# Patient Record
Sex: Female | Born: 1995 | Hispanic: Yes | Marital: Single
Health system: Western US, Academic
[De-identification: ages and names within clinical notes are randomized; demographics above are authoritative.]

## PROBLEM LIST (undated history)

## (undated) ENCOUNTER — Inpatient Hospital Stay (HOSPITAL_COMMUNITY): Payer: Self-pay

## (undated) DIAGNOSIS — Z789 Other specified health status: Secondary | ICD-10-CM

## (undated) HISTORY — PX: ANTERIOR CRUCIATE LIGAMENT REPAIR: SHX115

---
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
## 2015-07-25 ENCOUNTER — Encounter (HOSPITAL_COMMUNITY): Payer: Self-pay

## 2015-07-25 DIAGNOSIS — R300 Dysuria: Secondary | ICD-10-CM | POA: Diagnosis not present

## 2015-07-25 DIAGNOSIS — O210 Mild hyperemesis gravidarum: Secondary | ICD-10-CM | POA: Insufficient documentation

## 2015-07-25 DIAGNOSIS — Z3A10 10 weeks gestation of pregnancy: Secondary | ICD-10-CM | POA: Insufficient documentation

## 2015-07-25 DIAGNOSIS — R197 Diarrhea, unspecified: Secondary | ICD-10-CM | POA: Diagnosis not present

## 2015-07-25 DIAGNOSIS — O9989 Other specified diseases and conditions complicating pregnancy, childbirth and the puerperium: Secondary | ICD-10-CM | POA: Diagnosis present

## 2015-07-25 DIAGNOSIS — O418X11 Other specified disorders of amniotic fluid and membranes, first trimester, fetus 1: Secondary | ICD-10-CM | POA: Insufficient documentation

## 2015-07-25 LAB — I-STAT BETA HCG BLOOD, ED (MC, WL, AP ONLY): I-stat hCG, quantitative: >2000 m[IU]/mL — ABNORMAL HIGH (ref <5)

## 2015-07-25 MED ORDER — ACETAMINOPHEN 325 MG PO TABS
650.0000 mg | ORAL_TABLET | Freq: Once | ORAL | Status: AC
  Administered 2015-07-25: 650 mg via ORAL

## 2015-07-25 MED ORDER — ACETAMINOPHEN 325 MG PO TABS
ORAL_TABLET | ORAL | Status: AC
  Filled 2015-07-25: qty 2

## 2015-07-25 NOTE — ED Notes (Signed)
Pt is [redacted] weeks pregnant and has been having intermittent lower abd pain for about a week now. Tonight it became severe. Had some sharp abd pain and brown and clear discharge. Called the MD and they said the discharge is normal.

## 2015-07-26 ENCOUNTER — Emergency Department (HOSPITAL_COMMUNITY)
Admission: EM | Admit: 2015-07-26 | Discharge: 2015-07-26 | Disposition: A | Discharge status: Home / Self Care | Payer: Medicaid - Out of State | Attending: Emergency Medicine | Admitting: Emergency Medicine

## 2015-07-26 ENCOUNTER — Emergency Department (HOSPITAL_COMMUNITY): Payer: Medicaid - Out of State

## 2015-07-26 DIAGNOSIS — R109 Unspecified abdominal pain: Secondary | ICD-10-CM

## 2015-07-26 DIAGNOSIS — O468X1 Other antepartum hemorrhage, first trimester: Secondary | ICD-10-CM

## 2015-07-26 DIAGNOSIS — O418X10 Other specified disorders of amniotic fluid and membranes, first trimester, not applicable or unspecified: Secondary | ICD-10-CM

## 2015-07-26 DIAGNOSIS — Z3491 Encounter for supervision of normal pregnancy, unspecified, first trimester: Secondary | ICD-10-CM

## 2015-07-26 LAB — CBC WITH DIFFERENTIAL/PLATELET
BASOS ABS: 0 10*3/uL (ref 0.0–0.1)
BASOS PCT: 0 %
EOS ABS: 0 10*3/uL (ref 0.0–0.7)
EOS PCT: 0 %
HCT: 40.7 % (ref 36.0–46.0)
Hemoglobin: 13.9 g/dL (ref 12.0–15.0)
Lymphocytes Relative: 13 %
Lymphs Abs: 1.3 10*3/uL (ref 0.7–4.0)
MCH: 30.8 pg (ref 26.0–34.0)
MCHC: 34.2 g/dL (ref 30.0–36.0)
MCV: 90 fL (ref 78.0–100.0)
Monocytes Absolute: 0.5 10*3/uL (ref 0.1–1.0)
Monocytes Relative: 5 %
NEUTROS PCT: 82 %
Neutro Abs: 8.5 10*3/uL — ABNORMAL HIGH (ref 1.7–7.7)
Platelets: 252 10*3/uL (ref 150–400)
RBC: 4.52 MIL/uL (ref 3.87–5.11)
RDW: 13.1 % (ref 11.5–15.5)
WBC: 10.3 10*3/uL (ref 4.0–10.5)

## 2015-07-26 LAB — COMPREHENSIVE METABOLIC PANEL
ALT: 18 U/L (ref 14–54)
AST: 28 U/L (ref 15–41)
Albumin: 4.1 g/dL (ref 3.5–5.0)
Alkaline Phosphatase: 81 U/L (ref 38–126)
Anion gap: 11 (ref 5–15)
BILIRUBIN TOTAL: 1.2 mg/dL (ref 0.3–1.2)
BUN: 6 mg/dL (ref 6–20)
CALCIUM: 9.2 mg/dL (ref 8.9–10.3)
CO2: 23 mmol/L (ref 22–32)
CREATININE: 0.52 mg/dL (ref 0.44–1.00)
Chloride: 104 mmol/L (ref 101–111)
GFR calc Af Amer: >60 mL/min (ref >60)
Glucose, Bld: 62 mg/dL — ABNORMAL LOW (ref 65–99)
Potassium: 3.8 mmol/L (ref 3.5–5.1)
Sodium: 138 mmol/L (ref 135–145)
Total Protein: 7 g/dL (ref 6.5–8.1)

## 2015-07-26 LAB — TYPE AND SCREEN
ABO/RH(D): O POS
ANTIBODY SCREEN: NEGATIVE

## 2015-07-26 LAB — HCG, QUANTITATIVE, PREGNANCY: HCG, BETA CHAIN, QUANT, S: 144677 m[IU]/mL — AB (ref <5)

## 2015-07-26 LAB — URINALYSIS, ROUTINE W REFLEX MICROSCOPIC
BILIRUBIN URINE: NEGATIVE
Glucose, UA: NEGATIVE mg/dL
Hgb urine dipstick: NEGATIVE
Ketones, ur: 40 mg/dL — AB
Leukocytes, UA: NEGATIVE
Nitrite: NEGATIVE
Protein, ur: NEGATIVE mg/dL
SPECIFIC GRAVITY, URINE: 1.021 (ref 1.005–1.030)
pH: 7 (ref 5.0–8.0)

## 2015-07-26 LAB — ABO/RH: ABO/RH(D): O POS

## 2015-07-26 NOTE — ED Notes (Signed)
Pt has returned from ultra sound.  Waiting for a pelvic

## 2015-07-26 NOTE — ED Notes (Signed)
Lower abd pain for 3 weeks the pt is [redacted] weeks pregnant.  lmp nov she has had nv and one time diarrhea  edc aug 18th  .  She has had a brownish vaginal discharge since she was one month pregnant.  Now she has the discharge 2-3 times a week

## 2015-07-26 NOTE — ED Notes (Signed)
To us

## 2015-07-26 NOTE — ED Notes (Signed)
Pelvic cart setup bedside. 

## 2015-07-26 NOTE — ED Provider Notes (Signed)
CSN: [id number]     Arrival date & time 07/25/15  2003 History   First MD Initiated Contact with Patient 07/26/15 0054     Chief Complaint  Patient presents with  . Abdominal Pain     (Consider location/radiation/quality/duration/timing/severity/associated sxs/prior Treatment) HPI Comments: 20 year old female G1P0 at 9 weeks by LMP 05/21/15 who p/w abdominal pain. The patient states that she has had intermittent lower abdominal pain for the past one week. She has had intermittent spotting with brown discharge for the past one month but this has improved recently. Spotting only occurs 2-3 times per week. She denies any vaginal bleeding. Tonight, the abdominal pain became more severe which is why she presented for evaluation. She has had daily nausea and vomiting consistent with morning sickness. One episode of diarrhea a few days ago. No fevers. She has had mild intermittent dysuria for the past 1-2 weeks.  Patient is a 20 y.o. female presenting with abdominal pain. The history is provided by the patient.  Abdominal Pain   History reviewed. No pertinent past medical history. Past Surgical History  Procedure Laterality Date  . Anterior cruciate ligament repair Right    No family history on file. Social History  Substance Use Topics  . Smoking status: Never Smoker   . Smokeless tobacco: None  . Alcohol Use: No   OB History    Gravida Para Term Preterm AB TAB SAB Ectopic Multiple Living   1              Review of Systems  Gastrointestinal: Positive for abdominal pain.   10 Systems reviewed and are negative for acute change except as noted in the HPI.    Allergies  Review of patient's allergies indicates no known allergies.  Home Medications   Prior to Admission medications   Medication Sig Start Date End Date Taking? Authorizing Provider  Prenatal Vit-Fe Fumarate-FA (MULTIVITAMIN-PRENATAL) 27-0.8 MG TABS tablet Take 1 tablet by mouth daily at 12 noon.   Yes Historical  Provider, MD  ibuprofen (ADVIL,MOTRIN) 600 MG tablet Take 1 tablet (600 mg total) by mouth every 6 (six) hours as needed. Patient not taking: Reported on 07/26/2015 09/10/14   Loren Raceravid Yelverton, MD   BP 105/61 mmHg  Pulse 69  Temp(Src) 98.5 F (36.9 C) (Oral)  Resp 18  SpO2 98%  LMP 09/09/2014 Physical Exam  Constitutional: She is oriented to person, place, and time. She appears well-developed and well-nourished. No distress.  HENT:  Head: Normocephalic and atraumatic.  Moist mucous membranes  Eyes: Conjunctivae are normal. Pupils are equal, round, and reactive to light.  Neck: Neck supple.  Cardiovascular: Normal rate, regular rhythm and normal heart sounds.   No murmur heard. Pulmonary/Chest: Effort normal and breath sounds normal.  Abdominal: Soft. Bowel sounds are normal. She exhibits no distension. There is no rebound and no guarding.  Mild suprapubic TTP   Musculoskeletal: She exhibits no edema.  Neurological: She is alert and oriented to person, place, and time.  Fluent speech  Skin: Skin is warm and dry.  Psychiatric: She has a normal mood and affect. Judgment normal.  Nursing note and vitals reviewed.   ED Course  Procedures (including critical care time) Labs Review Labs Reviewed  COMPREHENSIVE METABOLIC PANEL - Abnormal; Notable for the following:    Glucose, Bld 62 (*)    All other components within normal limits  CBC WITH DIFFERENTIAL/PLATELET - Abnormal; Notable for the following:    Neutro Abs 8.5 (*)    All other  components within normal limits  HCG, QUANTITATIVE, PREGNANCY - Abnormal; Notable for the following:    hCG, Beta Chain, [place] Longest [id number] (*)    All other components within normal limits  URINALYSIS, ROUTINE W REFLEX MICROSCOPIC (NOT AT [facility]) - Abnormal; Notable for the following:    APPearance CLOUDY (*)    Ketones, ur 40 (*)    All other components within normal limits  I-STAT BETA HCG BLOOD, ED ([facility], [facility], [facility] ONLY) - Abnormal; Notable for the  following:    I-stat hCG, quantitative >2000.0 (*)    All other components within normal limits  TYPE AND SCREEN  ABO/RH  GC/CHLAMYDIA PROBE AMP ([place]) NOT AT [facility]    Imaging Review US Ob Comp Less 14 Wks  07/26/2015  CLINICAL DATA:  Acute onset of generalized abdominal pain. Initial encounter. EXAM: OBSTETRIC <14 WK ULTRASOUND TECHNIQUE: Transabdominal ultrasound was performed for evaluation of the gestation as well as the maternal uterus and adnexal regions. COMPARISON:  None. FINDINGS: Intrauterine gestational sac: Visualized/normal in shape. Yolk sac:  Yes Embryo:  Yes Cardiac Activity: Yes Heart Rate: 169 bpm CRL:   29.7  mm   9 w 6 d                  [place] EDC: 02/22/2016 Subchorionic hemorrhage: A small amount of subchorionic hemorrhage is noted. Maternal uterus/adnexae: The ovaries are unremarkable in appearance. The right ovary measures 3.0 x 1.5 cm, while the left ovary measures 3.5 x 1.9 x 2.5 cm. No suspicious adnexal masses are seen; there is no evidence for ovarian torsion. Trace free fluid is seen within the pelvic cul-de-sac. IMPRESSION: 1. Single live intrauterine pregnancy noted, with a crown-rump length of 3.0 cm, corresponding to a gestational age of [redacted] weeks 6 days. This matches the gestational age of [redacted] weeks 3 days by LMP, reflecting an estimated delivery of February 25, 2016. 2. Small amount of subchorionic hemorrhage noted. Electronically Signed   By: Roanna Raider M.D.   On: 07/26/2015 03:30   I have personally reviewed and evaluated these lab results as part of my medical decision-making.   EKG Interpretation None      MDM   Final diagnoses:  First trimester pregnancy  Subchorionic bleed, first trimester   Pt [redacted] weeks pregnant by LMP p/w 1 week of intermittent lower abdominal pain as well as 1 month of intermittent spotting of brown discharge. Patient well-appearing with normal vital signs. Mild suprapubic tenderness on exam. Obtained above labs as well as pelvic  ultrasound. Patient is Rh+. Labs including CMP unremarkable.  US shows 9wk6d pregnancy w/ small subchorionic hemorrhage. I discussed findings w/ patient. I offered pelvic exam for complete evaluation but pt politely refused as she is not having vaginal bleeding. I instructed to follow-up with OB/GYN; pt already has an appointment scheduled. Return precautions including heavy vaginal bleeding, worsening pain, or fever reviewed. Patient voiced understanding was discharged in satisfactory condition.   Laurence Spates, MD 07/26/15 [phone]

## 2017-06-27 IMAGING — US US OB COMP LESS 14 WK
1 series · 14 of 23 positions shown · non-contrast
Comparison: None.

CLINICAL DATA: Acute onset of generalized abdominal pain. Initial
encounter.

EXAM:
OBSTETRIC <14 WK ULTRASOUND
TECHNIQUE: Transabdominal ultrasound was performed for evaluation of the
gestation as well as the maternal uterus and adnexal regions.

[Series 1: us ob comp less 14 wk · 0.20mm/px · 14 of 23 slices shown]
[im 1/23]
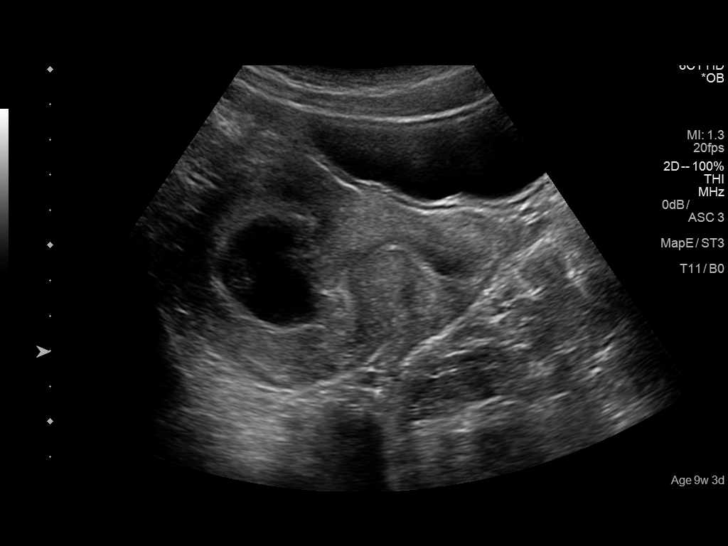
[im 3/23]
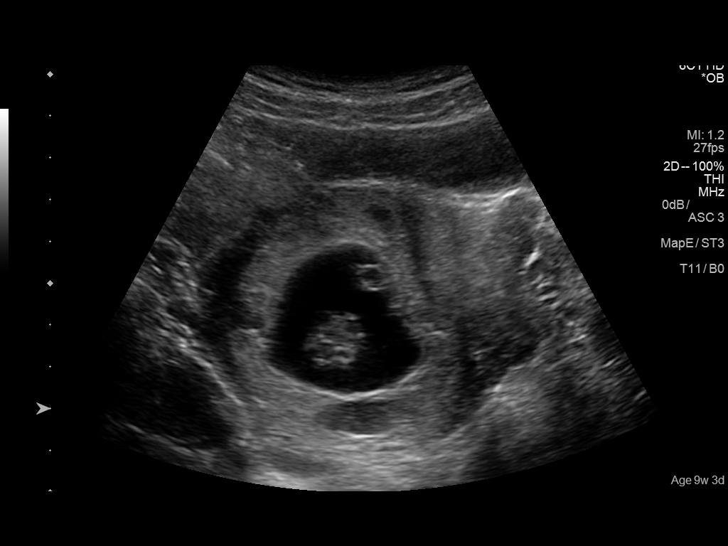
[im 5/23]
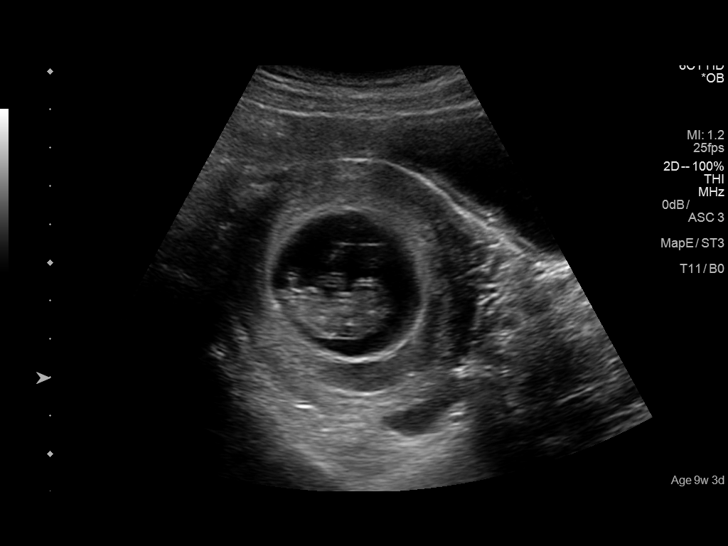
[im 6/23]
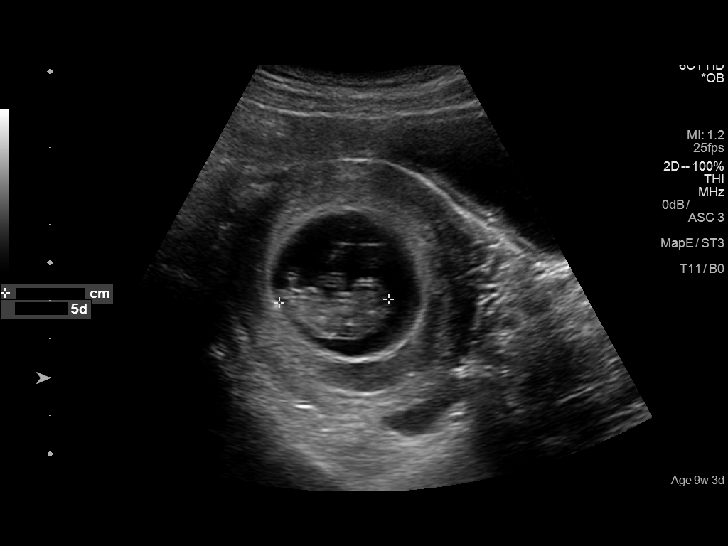
[im 8/23]
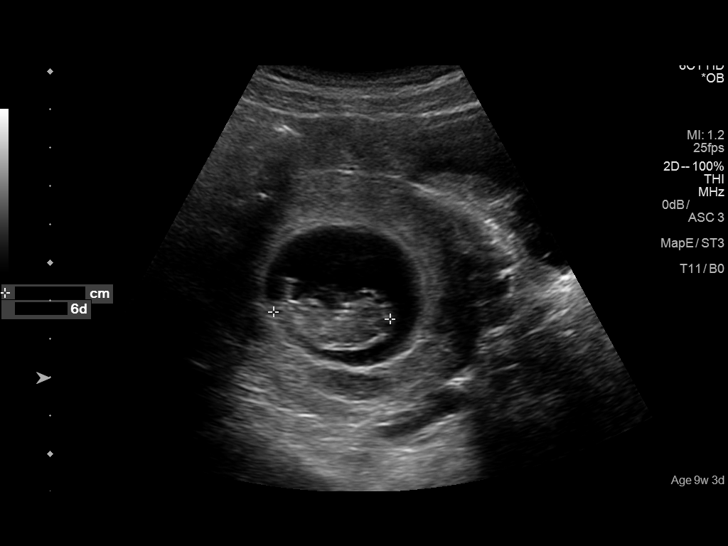
[im 10/23]
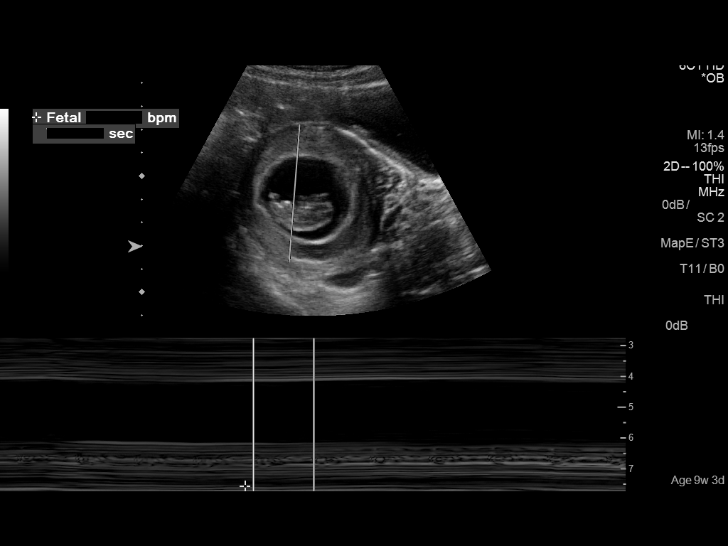
[im 11/23]
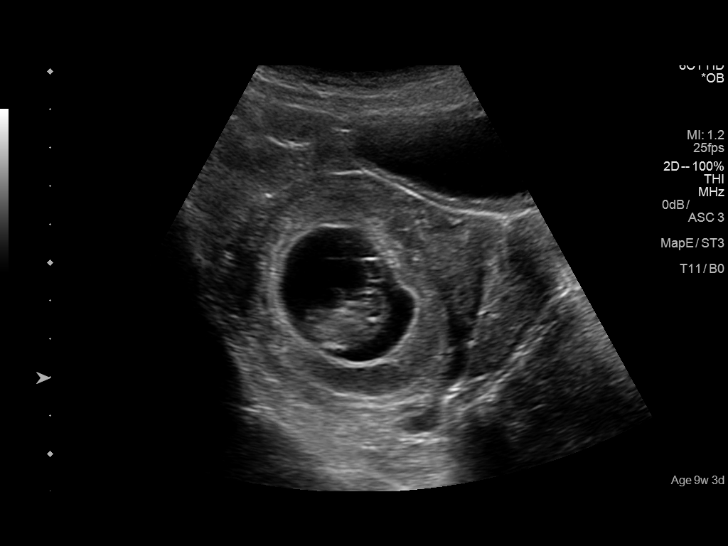
[im 13/23]
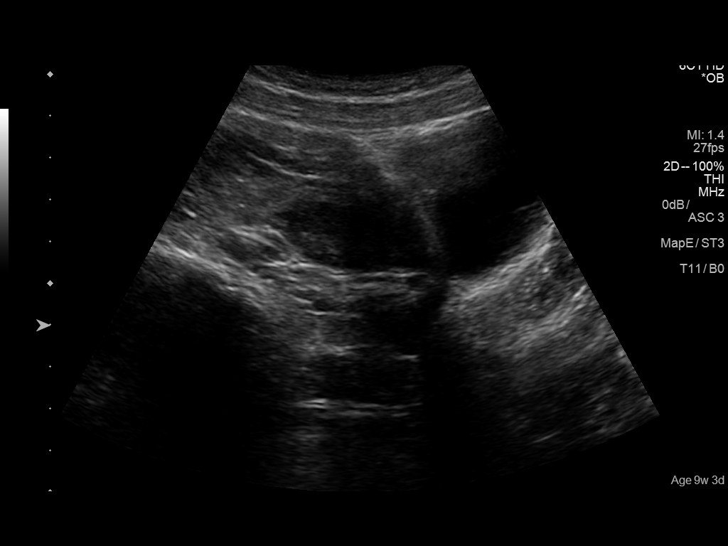
[im 14/23]
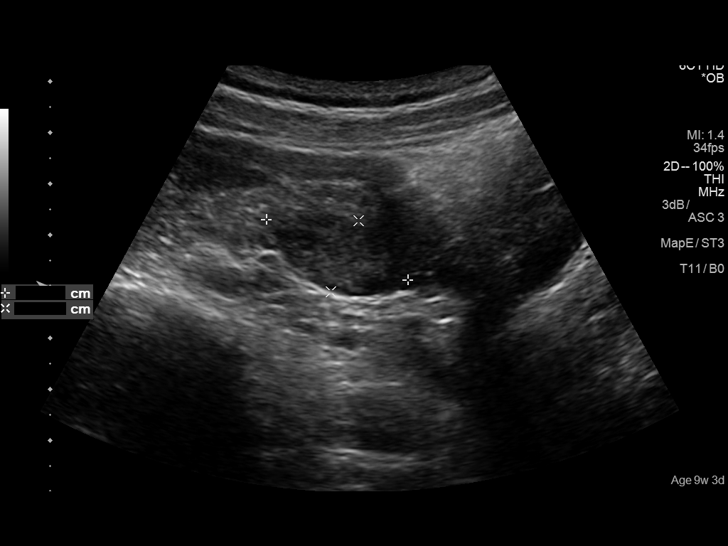
[im 16/23]
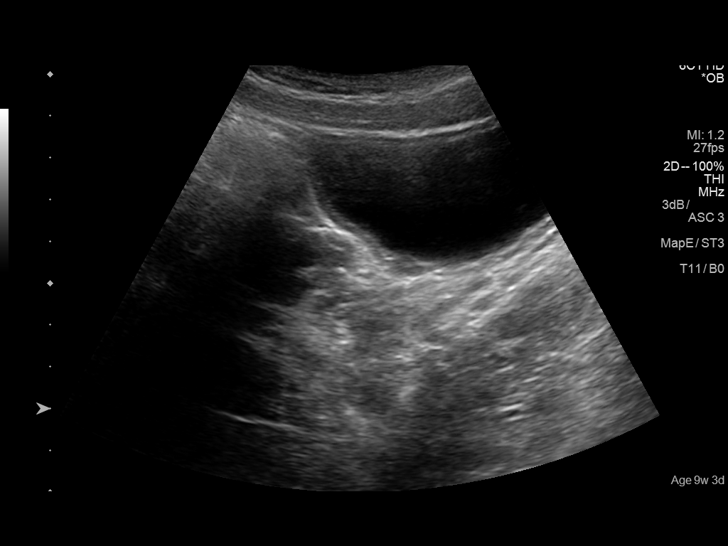
[im 18/23]
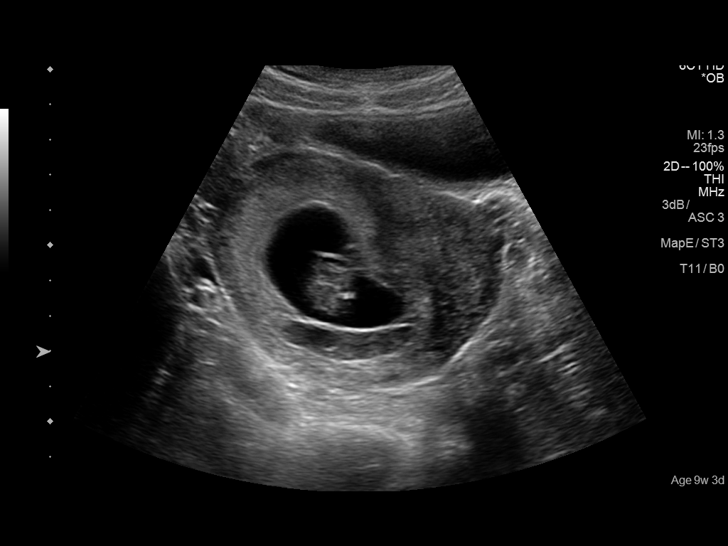
[im 19/23]
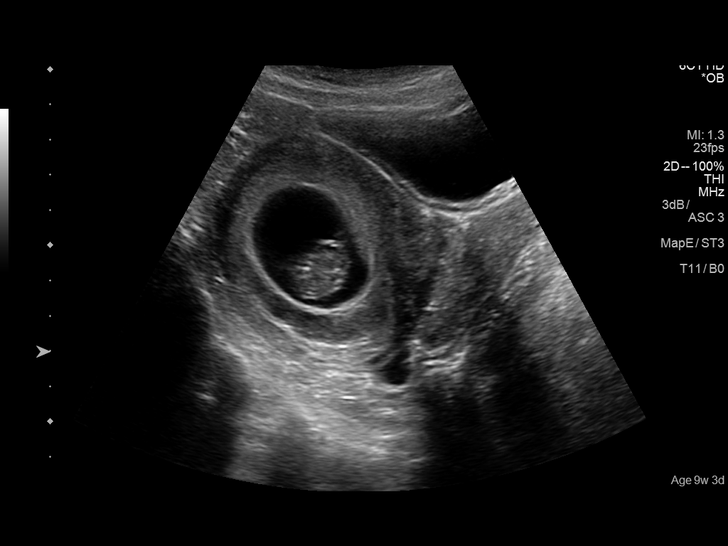
[im 21/23]
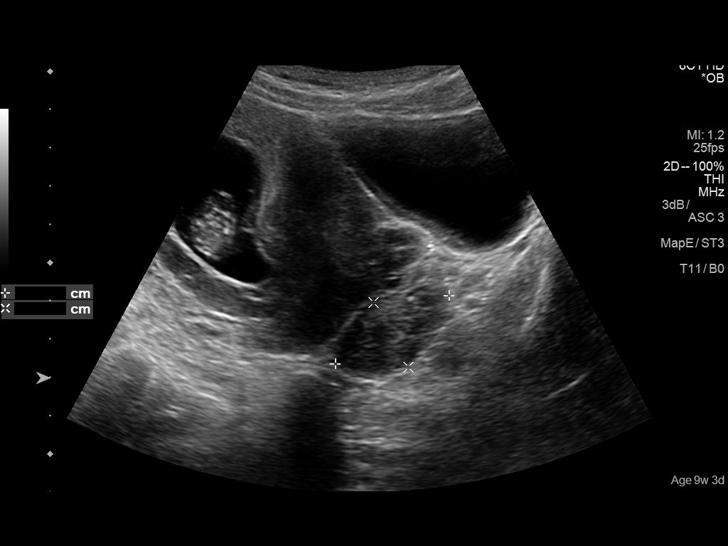
[im 23/23]
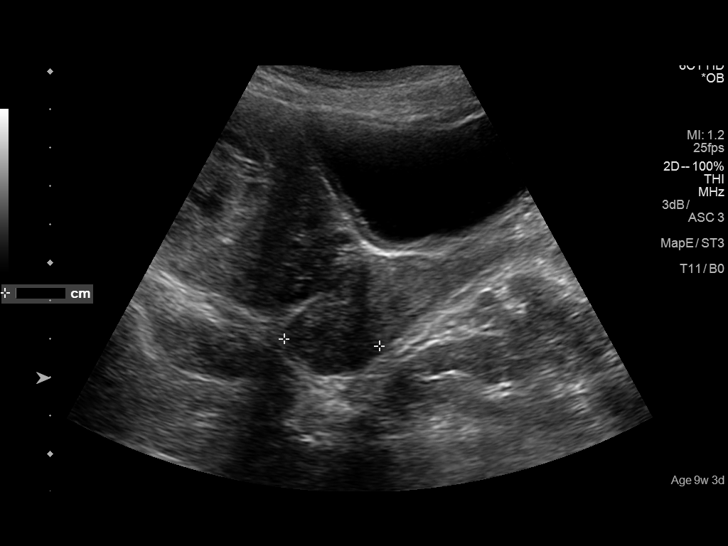

[14 of 23 positions shown; findings below may reference images not displayed]

FINDINGS: Intrauterine gestational sac: Visualized/normal in shape.

Yolk sac:  Yes

Embryo:  Yes

Cardiac Activity: Yes

Heart Rate: 169 bpm

CRL:   29.7  mm   9 w 6 d                  US EDC: 02/22/2016

Subchorionic hemorrhage: A small amount of subchorionic hemorrhage
is noted.

Maternal uterus/adnexae: The ovaries are unremarkable in appearance.
The right ovary measures 3.0 x 1.5 cm, while the left ovary measures
3.5 x 1.9 x 2.5 cm. No suspicious adnexal masses are seen; there is
no evidence for ovarian torsion.

Trace free fluid is seen within the pelvic cul-de-sac.
IMPRESSION: 1. Single live intrauterine pregnancy noted, with a crown-rump
length of 3.0 cm, corresponding to a gestational age of 9 weeks 6
days. This matches the gestational age of 9 weeks 3 days by LMP,
reflecting an estimated delivery February 25, 2016.
2. Small amount of subchorionic hemorrhage noted.

## 2017-09-18 ENCOUNTER — Emergency Department (HOSPITAL_COMMUNITY)
Admission: EM | Admit: 2017-09-18 | Discharge: 2017-09-18 | Discharge status: Against Medical Advice | Payer: PRIVATE HEALTH INSURANCE

## 2017-09-18 ENCOUNTER — Other Ambulatory Visit: Payer: Self-pay

## 2017-09-18 NOTE — ED Notes (Signed)
Called x 2 NO answer 

## 2018-01-22 ENCOUNTER — Encounter (HOSPITAL_COMMUNITY): Payer: Self-pay | Admitting: *Deleted

## 2018-01-22 ENCOUNTER — Other Ambulatory Visit: Payer: Self-pay | Admitting: Student

## 2018-01-22 ENCOUNTER — Inpatient Hospital Stay (HOSPITAL_COMMUNITY)
Admission: AD | Admit: 2018-01-22 | Discharge: 2018-01-22 | Discharge status: Against Medical Advice | Payer: Medicaid - Out of State | Source: Ambulatory Visit | Attending: Obstetrics & Gynecology | Admitting: Obstetrics & Gynecology

## 2018-01-22 ENCOUNTER — Emergency Department (HOSPITAL_COMMUNITY)
Admission: EM | Admit: 2018-01-22 | Discharge: 2018-01-22 | Discharge status: Against Medical Advice | Payer: PRIVATE HEALTH INSURANCE | Source: Home / Self Care

## 2018-01-22 ENCOUNTER — Telehealth: Payer: Self-pay | Admitting: Student

## 2018-01-22 ENCOUNTER — Ambulatory Visit (HOSPITAL_COMMUNITY)
Admission: EM | Admit: 2018-01-22 | Discharge: 2018-01-22 | Disposition: A | Discharge status: Home / Self Care | Payer: PRIVATE HEALTH INSURANCE | Source: Home / Self Care

## 2018-01-22 DIAGNOSIS — O26891 Other specified pregnancy related conditions, first trimester: Secondary | ICD-10-CM | POA: Insufficient documentation

## 2018-01-22 DIAGNOSIS — Z3A01 Less than 8 weeks gestation of pregnancy: Secondary | ICD-10-CM | POA: Diagnosis not present

## 2018-01-22 DIAGNOSIS — O26899 Other specified pregnancy related conditions, unspecified trimester: Secondary | ICD-10-CM

## 2018-01-22 DIAGNOSIS — R109 Unspecified abdominal pain: Principal | ICD-10-CM

## 2018-01-22 DIAGNOSIS — Z5321 Procedure and treatment not carried out due to patient leaving prior to being seen by health care provider: Secondary | ICD-10-CM | POA: Insufficient documentation

## 2018-01-22 DIAGNOSIS — O3680X0 Pregnancy with inconclusive fetal viability, not applicable or unspecified: Secondary | ICD-10-CM

## 2018-01-22 HISTORY — DX: Other specified health status: Z78.9

## 2018-01-22 LAB — URINALYSIS, ROUTINE W REFLEX MICROSCOPIC
Bilirubin Urine: NEGATIVE
Glucose, UA: NEGATIVE mg/dL
Hgb urine dipstick: NEGATIVE
Ketones, ur: NEGATIVE mg/dL
LEUKOCYTES UA: NEGATIVE
Nitrite: NEGATIVE
Protein, ur: NEGATIVE mg/dL
Specific Gravity, Urine: 1.016 (ref 1.005–1.030)
pH: 7 (ref 5.0–8.0)

## 2018-01-22 LAB — COMPREHENSIVE METABOLIC PANEL
ALK PHOS: 74 U/L (ref 38–126)
ALT: 14 U/L (ref 0–44)
AST: 20 U/L (ref 15–41)
Albumin: 4.2 g/dL (ref 3.5–5.0)
Anion gap: 8 (ref 5–15)
BILIRUBIN TOTAL: 1.7 mg/dL — AB (ref 0.3–1.2)
BUN: 10 mg/dL (ref 6–20)
CALCIUM: 8.9 mg/dL (ref 8.9–10.3)
CO2: 25 mmol/L (ref 22–32)
CREATININE: 0.56 mg/dL (ref 0.44–1.00)
Chloride: 102 mmol/L (ref 98–111)
GFR calc Af Amer: >60 mL/min (ref >60)
Glucose, Bld: 92 mg/dL (ref 70–99)
POTASSIUM: 3.8 mmol/L (ref 3.5–5.1)
Sodium: 135 mmol/L (ref 135–145)
TOTAL PROTEIN: 7.3 g/dL (ref 6.5–8.1)

## 2018-01-22 LAB — CBC
HEMATOCRIT: 38.7 % (ref 36.0–46.0)
HEMOGLOBIN: 13 g/dL (ref 12.0–15.0)
MCH: 29.1 pg (ref 26.0–34.0)
MCHC: 33.6 g/dL (ref 30.0–36.0)
MCV: 86.6 fL (ref 78.0–100.0)
Platelets: 271 10*3/uL (ref 150–400)
RBC: 4.47 MIL/uL (ref 3.87–5.11)
RDW: 13.3 % (ref 11.5–15.5)
WBC: 9.6 10*3/uL (ref 4.0–10.5)

## 2018-01-22 LAB — HCG, QUANTITATIVE, PREGNANCY: hCG, Beta Chain, Quant, S: 48 m[IU]/mL — ABNORMAL HIGH (ref <5)

## 2018-01-22 LAB — POCT PREGNANCY, URINE: PREG TEST UR: POSITIVE — AB

## 2018-01-22 NOTE — Telephone Encounter (Signed)
Called patient and informed her that her quant was [id number], and that she does not need to come for US tonight. Instead, [facility] for stat beta.  Reviewed bleeding and pain precautions; patient to come to [facility] if her symptoms worsen or change. Patient verbalized understanding.

## 2018-01-22 NOTE — ED Notes (Signed)
Pt presents with complaints of positive pregnancy test at home and missing one period. Pt endorses abdominal pain. [facility]

## 2018-01-22 NOTE — MAU Note (Signed)
Pt requesting to leave AMA, states she needs to go pick up her husband. Pt signs AMA papers and leaves after blood is drawn.

## 2018-01-22 NOTE — MAU Note (Signed)
Pt reports a positive preg test yesterday, has pressure like she needs to urinate all the time, lower abd pain, lower back pain.

## 2018-01-22 NOTE — MAU Provider Note (Signed)
Patient was here with positive pregnancy test and cramping; she declined to wait to be seen. Discussed with her the importance of staying for ectopic rule-out, and patient agrees to have beta HCG drawn, CBC and CMP drawn. States that she will return tonight at 859 for US. Cultures  and exam. Signed AMA papers and left.   Shannon Peters 01/22/2018, 6:25 PM

## 2018-01-24 ENCOUNTER — Inpatient Hospital Stay (HOSPITAL_COMMUNITY)
Admission: AD | Admit: 2018-01-24 | Discharge: 2018-01-25 | Disposition: A | Discharge status: Home / Self Care | Payer: Medicaid Other | Source: Ambulatory Visit | Attending: Obstetrics & Gynecology | Admitting: Obstetrics & Gynecology

## 2018-01-24 ENCOUNTER — Other Ambulatory Visit: Payer: Self-pay

## 2018-01-24 ENCOUNTER — Encounter (HOSPITAL_COMMUNITY): Payer: Self-pay

## 2018-01-24 DIAGNOSIS — N83201 Unspecified ovarian cyst, right side: Secondary | ICD-10-CM | POA: Diagnosis not present

## 2018-01-24 DIAGNOSIS — O26899 Other specified pregnancy related conditions, unspecified trimester: Secondary | ICD-10-CM

## 2018-01-24 DIAGNOSIS — O3680X0 Pregnancy with inconclusive fetal viability, not applicable or unspecified: Secondary | ICD-10-CM

## 2018-01-24 DIAGNOSIS — Z3A01 Less than 8 weeks gestation of pregnancy: Secondary | ICD-10-CM | POA: Diagnosis not present

## 2018-01-24 DIAGNOSIS — N8311 Corpus luteum cyst of right ovary: Secondary | ICD-10-CM

## 2018-01-24 DIAGNOSIS — O26891 Other specified pregnancy related conditions, first trimester: Secondary | ICD-10-CM | POA: Diagnosis not present

## 2018-01-24 DIAGNOSIS — Z79899 Other long term (current) drug therapy: Secondary | ICD-10-CM | POA: Diagnosis not present

## 2018-01-24 DIAGNOSIS — O3482 Maternal care for other abnormalities of pelvic organs, second trimester: Secondary | ICD-10-CM | POA: Insufficient documentation

## 2018-01-24 DIAGNOSIS — R102 Pelvic and perineal pain: Secondary | ICD-10-CM | POA: Insufficient documentation

## 2018-01-24 DIAGNOSIS — R109 Unspecified abdominal pain: Secondary | ICD-10-CM | POA: Diagnosis present

## 2018-01-24 LAB — CBC
HCT: 34.5 % — ABNORMAL LOW (ref 36.0–46.0)
HEMOGLOBIN: 11.9 g/dL — AB (ref 12.0–15.0)
MCH: 29.5 pg (ref 26.0–34.0)
MCHC: 34.5 g/dL (ref 30.0–36.0)
MCV: 85.4 fL (ref 78.0–100.0)
PLATELETS: 244 10*3/uL (ref 150–400)
RBC: 4.04 MIL/uL (ref 3.87–5.11)
RDW: 13.3 % (ref 11.5–15.5)
WBC: 8.4 10*3/uL (ref 4.0–10.5)

## 2018-01-24 NOTE — MAU Note (Signed)
Pt states that she started feeling abdominal cramping 4-5 days ago. The pt states that tonight the cramping has intensified.   She states that she feels like something is moving in the top of her abdomen.   Denies vaginal bleeding.

## 2018-01-25 ENCOUNTER — Ambulatory Visit: Payer: PRIVATE HEALTH INSURANCE

## 2018-01-25 ENCOUNTER — Inpatient Hospital Stay (HOSPITAL_COMMUNITY): Payer: Medicaid Other

## 2018-01-25 DIAGNOSIS — O26891 Other specified pregnancy related conditions, first trimester: Secondary | ICD-10-CM

## 2018-01-25 DIAGNOSIS — Z3A01 Less than 8 weeks gestation of pregnancy: Secondary | ICD-10-CM

## 2018-01-25 DIAGNOSIS — R102 Pelvic and perineal pain: Secondary | ICD-10-CM

## 2018-01-25 LAB — HIV ANTIBODY (ROUTINE TESTING W REFLEX): HIV Screen 4th Generation wRfx: NONREACTIVE

## 2018-01-25 LAB — WET PREP, GENITAL
Clue Cells Wet Prep HPF POC: NONE SEEN
Sperm: NONE SEEN
Trich, Wet Prep: NONE SEEN
YEAST WET PREP: NONE SEEN

## 2018-01-25 LAB — URINALYSIS, MICROSCOPIC (REFLEX): RBC / HPF: NONE SEEN RBC/hpf (ref 0–5)

## 2018-01-25 LAB — URINALYSIS, ROUTINE W REFLEX MICROSCOPIC
BILIRUBIN URINE: NEGATIVE
Glucose, UA: NEGATIVE mg/dL
Hgb urine dipstick: NEGATIVE
KETONES UR: NEGATIVE mg/dL
NITRITE: NEGATIVE
PROTEIN: NEGATIVE mg/dL
Specific Gravity, Urine: 1.02 (ref 1.005–1.030)
pH: 6 (ref 5.0–8.0)

## 2018-01-25 LAB — GC/CHLAMYDIA PROBE AMP (~~LOC~~) NOT AT ARMC
Chlamydia: NEGATIVE
NEISSERIA GONORRHEA: NEGATIVE

## 2018-01-25 LAB — HCG, QUANTITATIVE, PREGNANCY: HCG, BETA CHAIN, QUANT, S: 194 m[IU]/mL — AB (ref <5)

## 2018-01-25 NOTE — MAU Provider Note (Signed)
Chief Complaint: Abdominal Pain   Seen by provider at 0030 hrs    SUBJECTIVE HPI: Shannon Peters is a 22 y.o. G2P1000 at 3132w6d by LMP who presents to maternity admissions reporting pelvic cramping   Also feels movement and "pulsing" on the top of her abdomen at times. . She denies vaginal bleeding, vaginal itching/burning, urinary symptoms, h/a, dizziness, n/v, or fever/chills.   Was seen a couple of days ago but did not stay for exam  Hormone level for pregnancy was low, so was scheduled to have a repeat level tomorrow morning.   Abdominal Pain  This is a recurrent problem. The current episode started today. The onset quality is gradual. The problem occurs intermittently. The problem has been unchanged. The pain is located in the suprapubic region and epigastric region. The pain is mild. The quality of the pain is cramping. The abdominal pain does not radiate. Pertinent negatives include no constipation, diarrhea, dysuria, fever, myalgias, nausea or vomiting. Nothing aggravates the pain. The pain is relieved by nothing. She has tried nothing for the symptoms.   RN Note: Pt states that she started feeling abdominal cramping 4-5 days ago. The pt states that tonight the cramping has intensified.  She states that she feels like something is moving in the top of her abdomen.  Denies vaginal bleeding.    Past Medical History:  Diagnosis Date  . Medical history non-contributory    Past Surgical History:  Procedure Laterality Date  . ANTERIOR CRUCIATE LIGAMENT REPAIR Right    Social History   Socioeconomic History  . Marital status: Married    Spouse name: Not on file  . Number of children: Not on file  . Years of education: Not on file  . Highest education level: Not on file  Occupational History  . Not on file  Social Needs  . Financial resource strain: Not on file  . Food insecurity:    Worry: Not on file    Inability: Not on file  . Transportation needs:    Medical: Not on  file    Non-medical: Not on file  Tobacco Use  . Smoking status: Never Smoker  . Smokeless tobacco: Never Used  Substance and Sexual Activity  . Alcohol use: No  . Drug use: No  . Sexual activity: Yes    Birth control/protection: None  Lifestyle  . Physical activity:    Days per week: Not on file    Minutes per session: Not on file  . Stress: Not on file  Relationships  . Social connections:    Talks on phone: Not on file    Gets together: Not on file    Attends religious service: Not on file    Active member of club or organization: Not on file    Attends meetings of clubs or organizations: Not on file    Relationship status: Not on file  . Intimate partner violence:    Fear of current or ex partner: Not on file    Emotionally abused: Not on file    Physically abused: Not on file    Forced sexual activity: Not on file  Other Topics Concern  . Not on file  Social History Narrative  . Not on file   No current facility-administered medications on file prior to encounter.    Current Outpatient Medications on File Prior to Encounter  Medication Sig Dispense Refill  . ibuprofen (ADVIL,MOTRIN) 600 MG tablet Take 1 tablet (600 mg total) by mouth every 6 (six)  hours as needed. (Patient not taking: Reported on 07/26/2015) 30 tablet 0  . Prenatal Vit-Fe Fumarate-FA (MULTIVITAMIN-PRENATAL) 27-0.8 MG TABS tablet Take 1 tablet by mouth daily at 12 noon.     No Known Allergies  I have reviewed patient's Past Medical Hx, Surgical Hx, Family Hx, Social Hx, medications and allergies.   ROS:  Review of Systems  Constitutional: Negative for fever.  Gastrointestinal: Positive for abdominal pain. Negative for constipation, diarrhea, nausea and vomiting.  Genitourinary: Negative for dysuria.  Musculoskeletal: Negative for myalgias.   Review of Systems  Other systems negative   Physical Exam  Physical Exam Patient Vitals for the past 24 hrs:  BP Temp Pulse Resp SpO2 Weight   01/24/18 2330 120/67 98.4 F (36.9 C) 78 12 100 % -  01/24/18 2325 - - - - - 117 lb 6 oz (53.2 kg)   Constitutional: Well-developed, well-nourished female in no acute distress.  Cardiovascular: normal rate Respiratory: normal effort GI: Abd soft, non-tender. Pos BS x 4 MS: Extremities nontender, no edema, normal ROM Neurologic: Alert and oriented x 4.  GU: Neg CVAT.  PELVIC EXAM: Cervix pink, visually closed, without lesion, scant white creamy discharge, vaginal walls and external genitalia normal Bimanual exam: Cervix 0/long/high, firm, anterior, neg CMT, uterus nontender, nonenlarged, adnexa without tenderness, enlargement, or mass  LAB RESULTS Results for orders placed or performed during the hospital encounter of 01/24/18 (from the past 24 hour(s))  CBC     Status: Abnormal   Collection Time: 01/24/18 11:50 PM  Result Value Ref Range   WBC 8.4 4.0 - 10.5 K/uL   RBC 4.04 3.87 - 5.11 MIL/uL   Hemoglobin 11.9 (L) 12.0 - 15.0 g/dL   HCT 09.8 (L) 11.9 - 14.7 %   MCV 85.4 78.0 - 100.0 fL   MCH 29.5 26.0 - 34.0 pg   MCHC 34.5 30.0 - 36.0 g/dL   RDW 82.9 56.2 - 13.0 %   Platelets 244 150 - 400 K/uL  hCG, quantitative, pregnancy     Status: Abnormal   Collection Time: 01/24/18 11:50 PM  Result Value Ref Range   hCG, Beta Chain, Quant, S 194 (H) <5 mIU/mL    Ref. Range 01/22/2018 18:19  HCG, Beta Chain, Quant, S Latest Ref Range: <5 mIU/mL 48 (H)       IMAGING US Ob Comp Less 14 Wks  Result Date: 01/25/2018 CLINICAL DATA:  22 year old positive HCG levels presenting with female with pelvic pain. EXAM: OBSTETRIC <14 WK [place] AND TRANSVAGINAL OB US TECHNIQUE: Both transabdominal and transvaginal ultrasound examinations were performed for complete evaluation of the gestation as well as the maternal uterus, adnexal regions, and pelvic cul-de-sac. Transvaginal technique was performed to assess early pregnancy. COMPARISON:  None. FINDINGS: The uterus is anteverted appears unremarkable.  The endometrium is unremarkable and measures up to 18 mm at the fundus. No intrauterine pregnancy identified. The right ovary measures 3.6 x 2.4 x 2.2 cm. There is a 3.5 x 3.3 x 3.0 cm complex cyst with lace-like internal structures in the right ovary most consistent with a hemorrhagic cyst or corpus luteum. No internal vascularity within this is noted. The left ovary measures 3.1 x 1.8 x 1.9 cm appears unremarkable. No free fluid within the pelvis. IMPRESSION: 1. No intrauterine pregnancy identified and no adnexal masses. This is considered a pregnancy of unknown location and differential diagnosis includes an early pregnancy, recent spontaneous abortion, or an ectopic pregnancy. Clinical correlation and follow-up with serial HCG levels and ultrasound recommended.  2. Right ovarian hemorrhagic cyst/corpus luteum. Electronically Signed   By: Elgie Collard M.D.   On: 01/25/2018 01:49   US Ob Transvaginal  Result Date: 01/25/2018 CLINICAL DATA:  22 year old positive HCG levels presenting with female with pelvic pain. EXAM: OBSTETRIC <14 WK [place] AND TRANSVAGINAL OB US TECHNIQUE: Both transabdominal and transvaginal ultrasound examinations were performed for complete evaluation of the gestation as well as the maternal uterus, adnexal regions, and pelvic cul-de-sac. Transvaginal technique was performed to assess early pregnancy. COMPARISON:  None. FINDINGS: The uterus is anteverted appears unremarkable. The endometrium is unremarkable and measures up to 18 mm at the fundus. No intrauterine pregnancy identified. The right ovary measures 3.6 x 2.4 x 2.2 cm. There is a 3.5 x 3.3 x 3.0 cm complex cyst with lace-like internal structures in the right ovary most consistent with a hemorrhagic cyst or corpus luteum. No internal vascularity within this is noted. The left ovary measures 3.1 x 1.8 x 1.9 cm appears unremarkable. No free fluid within the pelvis. IMPRESSION: 1. No intrauterine pregnancy identified and no adnexal  masses. This is considered a pregnancy of unknown location and differential diagnosis includes an early pregnancy, recent spontaneous abortion, or an ectopic pregnancy. Clinical correlation and follow-up with serial HCG levels and ultrasound recommended. 2. Right ovarian hemorrhagic cyst/corpus luteum. Electronically Signed   By: Elgie Collard M.D.   On: 01/25/2018 01:49    MAU Management/MDM: Ordered usual first trimester r/o ectopic labs.   Pelvic exam and cultures done Will check baseline Ultrasound to rule out ectopic.  This bleeding/pain can represent a normal pregnancy with bleeding, spontaneous abortion or even an ectopic which can be life-threatening.  The process as listed above helps to determine which of these is present.  HCG level more than doubled in less than 48 hours US showed nothing in uterus, as expected Discussed we cannot rule out ectopic just yet Will do one more HCG in 2 days then I ordered followup US in 10 days (around 29th)  ASSESSMENT 1. Pelvic pain affecting pregnancy   2. Pelvic pain affecting pregnancy   3.   Pregnancy of unknown location 4.    Pregnancy at [redacted]w[redacted]d by LMP  PLAN Discharge home Plan to repeat HCG level in 48 hours in MAU I told her she probably did not have to wait for result Sunday, since her levels doubled nicely and [place] is planned for 10 days.  I sent message to cancel her HCG for tomorrow morning Will repeat  Ultrasound in about 7-10 days if HCG levels double appropriately  Ectopic precautions   Pt stable at time of discharge. Encouraged to return here or to other Urgent Care/ED if she develops worsening of symptoms, increase in pain, fever, or other concerning symptoms.    Wynelle Bourgeois CNM, MSN Certified Nurse-Midwife 01/25/2018  12:34 AM

## 2018-01-25 NOTE — Discharge Instructions (Signed)
Abdominal Pain During Pregnancy °Belly (abdominal) pain is common during pregnancy. Most of the time, it is not a serious problem. Other times, it can be a sign that something is wrong with the pregnancy. Always tell your doctor if you have belly pain. °Follow these instructions at home: °Monitor your belly pain for any changes. The following actions may help you feel better: °· Do not have sex (intercourse) or put anything in your vagina until you feel better. °· Rest until your pain stops. °· Drink clear fluids if you feel sick to your stomach (nauseous). Do not eat solid food until you feel better. °· Only take medicine as told by your doctor. °· Keep all doctor visits as told. ° °Get help right away if: °· You are bleeding, leaking fluid, or pieces of tissue come out of your vagina. °· You have more pain or cramping. °· You keep throwing up (vomiting). °· You have pain when you pee (urinate) or have blood in your pee. °· You have a fever. °· You do not feel your baby moving as much. °· You feel very weak or feel like passing out. °· You have trouble breathing, with or without belly pain. °· You have a very bad headache and belly pain. °· You have fluid leaking from your vagina and belly pain. °· You keep having watery poop (diarrhea). °· Your belly pain does not go away after resting, or the pain gets worse. °This information is not intended to replace advice given to you by your health care provider. Make sure you discuss any questions you have with your health care provider. °Document Released: 06/14/2009 Document Revised: 02/02/2016 Document Reviewed: 01/23/2013 °Elsevier Interactive Patient Education © 2018 [employer] ° °First Trimester of Pregnancy °The first trimester of pregnancy is from week 1 until the end of week 13 (months 1 through 3). During this time, your baby will begin to develop inside you. At 6-8 weeks, the eyes and face are formed, and the heartbeat can be seen on ultrasound. At the end of  12 weeks, all the baby's organs are formed. Prenatal care is all the medical care you receive before the birth of your baby. Make sure you get good prenatal care and follow all of your doctor's instructions. °Follow these instructions at home: °Medicines °· Take over-the-counter and prescription medicines only as told by your doctor. Some medicines are safe and some medicines are not safe during pregnancy. °· Take a prenatal vitamin that contains at least 600 micrograms (mcg) of folic acid. °· If you have trouble pooping (constipation), take medicine that will make your stool soft (stool softener) if your doctor approves. °Eating and drinking °· Eat regular, healthy meals. °· Your doctor will tell you the amount of weight gain that is right for you. °· Avoid raw meat and uncooked cheese. °· If you feel sick to your stomach (nauseous) or throw up (vomit): °? Eat 4 or 5 small meals a day instead of 3 large meals. °? Try eating a few soda crackers. °? Drink liquids between meals instead of during meals. °· To prevent constipation: °? Eat foods that are high in fiber, like fresh fruits and vegetables, whole grains, and beans. °? Drink enough fluids to keep your pee (urine) clear or pale yellow. °Activity °· Exercise only as told by your doctor. Stop exercising if you have cramps or pain in your lower belly (abdomen) or low back. °· Do not exercise if it is too hot, too humid, or   if you are in a place of great height (high altitude). °· Try to avoid standing for long periods of time. Move your legs often if you must stand in one place for a long time. °· Avoid heavy lifting. °· Wear low-heeled shoes. Sit and stand up straight. °· You can have sex unless your doctor tells you not to. °Relieving pain and discomfort °· Wear a good support bra if your breasts are sore. °· Take warm water baths (sitz baths) to soothe pain or discomfort caused by hemorrhoids. Use hemorrhoid cream if your doctor says it is okay. °· Rest with  your legs raised if you have leg cramps or low back pain. °· If you have puffy, bulging veins (varicose veins) in your legs: °? Wear support hose or compression stockings as told by your doctor. °? Raise (elevate) your feet for 15 minutes, 3-4 times a day. °? Limit salt in your food. °Prenatal care °· Schedule your prenatal visits by the twelfth week of pregnancy. °· Write down your questions. Take them to your prenatal visits. °· Keep all your prenatal visits as told by your doctor. This is important. °Safety °· Wear your seat belt at all times when driving. °· Make a list of emergency phone numbers. The list should include numbers for family, friends, the hospital, and police and fire departments. °General instructions °· Ask your doctor for a referral to a local prenatal class. Begin classes no later than at the start of month 6 of your pregnancy. °· Ask for help if you need counseling or if you need help with nutrition. Your doctor can give you advice or tell you where to go for help. °· Do not use hot tubs, steam rooms, or saunas. °· Do not douche or use tampons or scented sanitary pads. °· Do not cross your legs for long periods of time. °· Avoid all herbs and alcohol. Avoid drugs that are not approved by your doctor. °· Do not use any tobacco products, including cigarettes, chewing tobacco, and electronic cigarettes. If you need help quitting, ask your doctor. You may get counseling or other support to help you quit. °· Avoid cat litter boxes and soil used by cats. These carry germs that can cause birth defects in the baby and can cause a loss of your baby (miscarriage) or stillbirth. °· Visit your dentist. At home, brush your teeth with a soft toothbrush. Be gentle when you floss. °Contact a doctor if: °· You are dizzy. °· You have mild cramps or pressure in your lower belly. °· You have a nagging pain in your belly area. °· You continue to feel sick to your stomach, you throw up, or you have watery poop  (diarrhea). °· You have a bad smelling fluid coming from your vagina. °· You have pain when you pee (urinate). °· You have increased puffiness (swelling) in your face, hands, legs, or ankles. °Get help right away if: °· You have a fever. °· You are leaking fluid from your vagina. °· You have spotting or bleeding from your vagina. °· You have very bad belly cramping or pain. °· You gain or lose weight rapidly. °· You throw up blood. It may look like coffee grounds. °· You are around people who have German measles, fifth disease, or chickenpox. °· You have a very bad headache. °· You have shortness of breath. °· You have any kind of trauma, such as from a fall or a car accident. °Summary °· The first trimester of   pregnancy is from week 1 until the end of week 13 (months 1 through 3). °· To take care of yourself and your unborn baby, you will need to eat healthy meals, take medicines only if your doctor tells you to do so, and do activities that are safe for you and your baby. °· Keep all follow-up visits as told by your doctor. This is important as your doctor will have to ensure that your baby is healthy and growing well. °This information is not intended to replace advice given to you by your health care provider. Make sure you discuss any questions you have with your health care provider. °Document Released: 12/13/2007 Document Revised: 07/04/2016 Document Reviewed: 07/04/2016 °Elsevier Interactive Patient Education © 2017 [employer] ° °

## 2018-02-04 ENCOUNTER — Inpatient Hospital Stay (HOSPITAL_COMMUNITY): Payer: PRIVATE HEALTH INSURANCE

## 2018-02-04 ENCOUNTER — Inpatient Hospital Stay (HOSPITAL_COMMUNITY)
Admission: AD | Admit: 2018-02-04 | Discharge: 2018-02-04 | Discharge status: Against Medical Advice | Payer: PRIVATE HEALTH INSURANCE | Source: Ambulatory Visit | Attending: Obstetrics and Gynecology | Admitting: Obstetrics and Gynecology

## 2018-02-04 DIAGNOSIS — O3680X0 Pregnancy with inconclusive fetal viability, not applicable or unspecified: Secondary | ICD-10-CM

## 2018-02-04 DIAGNOSIS — Z3A01 Less than 8 weeks gestation of pregnancy: Secondary | ICD-10-CM

## 2018-02-04 NOTE — MAU Note (Signed)
Called patient, not in lobby

## 2018-02-04 NOTE — MAU Note (Signed)
Called Patient, not in lobby

## 2018-02-04 NOTE — MAU Note (Signed)
Pt called, not in lobby 

## 2018-02-05 ENCOUNTER — Encounter (HOSPITAL_COMMUNITY): Payer: Self-pay | Admitting: Student

## 2018-02-05 ENCOUNTER — Inpatient Hospital Stay (HOSPITAL_COMMUNITY)
Admission: AD | Admit: 2018-02-05 | Discharge: 2018-02-05 | Disposition: A | Discharge status: Home / Self Care | Payer: Medicaid Other | Source: Ambulatory Visit | Attending: Family Medicine | Admitting: Family Medicine

## 2018-02-05 ENCOUNTER — Inpatient Hospital Stay (HOSPITAL_COMMUNITY): Payer: Medicaid Other

## 2018-02-05 DIAGNOSIS — Z3A01 Less than 8 weeks gestation of pregnancy: Secondary | ICD-10-CM | POA: Diagnosis not present

## 2018-02-05 DIAGNOSIS — O30041 Twin pregnancy, dichorionic/diamniotic, first trimester: Secondary | ICD-10-CM | POA: Insufficient documentation

## 2018-02-05 DIAGNOSIS — R109 Unspecified abdominal pain: Secondary | ICD-10-CM | POA: Diagnosis present

## 2018-02-05 DIAGNOSIS — O26891 Other specified pregnancy related conditions, first trimester: Secondary | ICD-10-CM | POA: Diagnosis not present

## 2018-02-05 LAB — CBC
HCT: 35.4 % — ABNORMAL LOW (ref 36.0–46.0)
HEMOGLOBIN: 12.1 g/dL (ref 12.0–15.0)
MCH: 29.5 pg (ref 26.0–34.0)
MCHC: 34.2 g/dL (ref 30.0–36.0)
MCV: 86.3 fL (ref 78.0–100.0)
PLATELETS: 300 10*3/uL (ref 150–400)
RBC: 4.1 MIL/uL (ref 3.87–5.11)
RDW: 13.5 % (ref 11.5–15.5)
WBC: 8.2 10*3/uL (ref 4.0–10.5)

## 2018-02-05 LAB — HCG, QUANTITATIVE, PREGNANCY: hCG, Beta Chain, Quant, S: 21028 m[IU]/mL — ABNORMAL HIGH (ref <5)

## 2018-02-05 NOTE — MAU Note (Signed)
Pt was told she would need repeat labwork & U/S, having lower back pain & abd cramping.  Denies bleeding.  Was here yesterday but had to leave before being seen.

## 2018-02-05 NOTE — Discharge Instructions (Signed)
[place] [facility]   [facility]       Phone: [phone]  [facility] Phone: [phone]  [facility] [place]  Phone: [phone]  [facility]  Phone: [phone]  [facility]  Phone: [phone]  [facility]       Phone: [phone]  [facility]    Phone: [phone]   [facility]    Phone: [phone]  [facility]    Phone: [phone]  [facility]    Phone: [phone]   [facility]  Phone: [phone]  [facility]    Phone: [phone]  [facility]   Phone: [phone]  [facility]    Phone: [phone]      Multiple Pregnancy Having a multiple pregnancy means that a woman is carrying more than one baby at a time. She may be pregnant with twins, triplets, or more. The majority of multiple pregnancies are twins. Naturally conceiving triplets or more (higher-order multiples) is rare. Multiple pregnancies are riskier than single pregnancies. A woman with a multiple pregnancy is more likely to have certain problems during her pregnancy. Therefore, she will need to have more frequent appointments for prenatal care. How does a multiple pregnancy happen? A multiple pregnancy happens when:  The woman's body releases more than one egg at a time, and then each egg gets fertilized by a different sperm. ? This is the most common type of multiple pregnancy. ? Twins or other multiples produced this way are fraternal. They are no more alike than non-multiple siblings are.  One sperm fertilizes one egg, which then divides into more than one embryo. ? Twins or other multiples produced this way  are identical. Identical multiples are always the same gender, and they look very much alike.  Who is most likely to have a multiple pregnancy? A multiple pregnancy is more likely to develop in women who:  Have had fertility treatment, especially if the treatment included fertility drugs.  Are older than 22 years of age.  Have already had four or more children.  Have a family history of multiple pregnancy.  How is a multiple pregnancy diagnosed? A multiple pregnancy may be diagnosed based on:  Symptoms such as: ? Rapid weight gain in the first 3 months of pregnancy (first trimester). ? More severe nausea and breast tenderness than what is typical of a single pregnancy. ? The uterus measuring larger than what is normal for the stage of the pregnancy.  Blood tests that detect a higher-than-normal level of human chorionic gonadotropin (hCG). This is a hormone that your body produces in early pregnancy.  Ultrasound exam. This is used to confirm that you are carrying multiples.  What risks are associated with multiple pregnancy? A multiple pregnancy puts you at a higher risk for certain problems during or after your pregnancy, including:  Having your babies delivered before you have reached a full-term pregnancy (preterm birth). A full-term pregnancy lasts for at least 37 weeks. Babies born before 37 weeks may have a higher risk of a variety of health problems, such as breathing problems, feeding difficulties, cerebral palsy, and learning disabilities.  Diabetes.  Preeclampsia. This is a serious condition that causes high blood pressure along with other symptoms, such as swelling and headaches, during pregnancy.  Excessive blood  loss after childbirth (postpartum hemorrhage).  Postpartum depression.  Low birth weight of the babies.  How will having a multiple pregnancy affect my care? Your health care provider will want to monitor you more closely during your pregnancy to make sure  that your babies are growing normally and that you are healthy. Follow these instructions at home: Because your pregnancy is considered to be high risk, you will need to work closely with your health care team. You may also need to make some lifestyle changes. These may include the following: Eating and drinking  Increase your nutrition. ? Follow your health care providers recommendations for weight gain. You may need to gain a little extra weight when you are pregnant with multiples. ? Eat healthy snacks often throughout the day. This can add calories and reduce nausea.  Drink enough fluid to keep your urine clear or pale yellow.  Take prenatal vitamins. Activity By 20-24 weeks, you may need to limit your activities.  Avoid activities and work that take a lot of effort (are strenuous).  Ask your health care provider when you should stop having sexual intercourse.  Rest often.  General instructions  Do not use any products that contain nicotine or tobacco, such as cigarettes and e-cigarettes. If you need help quitting, ask your health care provider.  Do not drink alcohol or use illegal drugs.  Take over-the-counter and prescription medicines only as told by your health care provider.  Arrange for extra help around the house.  Keep all follow-up visits and all prenatal visits as told by your health care provider. This is important. Contact a health care provider if:  You have dizziness.  You have persistent nausea, vomiting, or diarrhea.  You are having trouble gaining weight.  You have feelings of depression or other emotions that are interfering with your normal activities. Get help right away if:  You have a fever.  You have pain with urination.  You have fluid leaking from your vagina.  You have a bad-smelling vaginal discharge.  You notice increased swelling in your face, hands, legs, or ankles.  You have spotting or bleeding from your vagina.  You have  pelvic cramps, pelvic pressure, or nagging pain in your abdomen or lower back.  You are having regular contractions.  You develop a severe headache, with or without visual changes.  You have shortness of breath or chest pain.  You notice less fetal movement, or no fetal movement. This information is not intended to replace advice given to you by your health care provider. Make sure you discuss any questions you have with your health care provider. Document Released: 04/04/2008 Document Revised: 02/25/2016 Document Reviewed: 02/25/2016 Elsevier Interactive Patient Education  [employer].

## 2018-02-05 NOTE — MAU Provider Note (Signed)
History   [id number]   Chief Complaint  Patient presents with  . Abdominal Pain  . Back Pain  . Follow-up    HPI Shannon Peters is a 22 y.o. female  G2P1000 here with for follow-up ultrasound.  Upon review of the records patient was first seen on 7/16 for abdominal cramping. BHCG on that day was 48.  Ultrasound was not done as patient left AMA prior.  GC/CT and wet prep were collected on 7/19 & were negative. On 7/19 when the patient returned to MAU, her HCG was 194. An outpatient ultrasound was ordered for 10 days later but looks like it was never scheduled. Currently patient reports continued lower abdominal cramping that she rates  5/10; no change in symptoms. Denies vaginal bleeding.   Patient's last menstrual period was 12/29/2017.  OB History  Gravida Para Term Preterm AB Living  2 1 1         SAB TAB Ectopic Multiple Live Births               # Outcome Date GA Lbr Len/2nd Weight Sex Delivery Anes PTL Lv  2 Current           1 Term      Vag-Forceps       Past Medical History:  Diagnosis Date  . Medical history non-contributory     Family History  Problem Relation Age of Onset  . Diabetes Maternal Aunt   . Diabetes Maternal Uncle   . Diabetes Maternal Grandmother   . Diabetes Maternal Grandfather     Social History   Socioeconomic History  . Marital status: Married    Spouse name: Not on file  . Number of children: Not on file  . Years of education: Not on file  . Highest education level: Not on file  Occupational History  . Not on file  Social Needs  . Financial resource strain: Not on file  . Food insecurity:    Worry: Not on file    Inability: Not on file  . Transportation needs:    Medical: Not on file    Non-medical: Not on file  Tobacco Use  . Smoking status: Never Smoker  . Smokeless tobacco: Never Used  Substance and Sexual Activity  . Alcohol use: No  . Drug use: No  . Sexual activity: Yes    Birth control/protection: None  Lifestyle   . Physical activity:    Days per week: Not on file    Minutes per session: Not on file  . Stress: Not on file  Relationships  . Social connections:    Talks on phone: Not on file    Gets together: Not on file    Attends religious service: Not on file    Active member of club or organization: Not on file    Attends meetings of clubs or organizations: Not on file    Relationship status: Not on file  Other Topics Concern  . Not on file  Social History Narrative  . Not on file    No Known Allergies  No current facility-administered medications on file prior to encounter.    Current Outpatient Medications on File Prior to Encounter  Medication Sig Dispense Refill  . Prenatal Vit-Fe Fumarate-FA (MULTIVITAMIN-PRENATAL) 27-0.8 MG TABS tablet Take 1 tablet by mouth daily at 12 noon.       Physical Exam   Vitals:   02/05/18 1530  BP: 91/62  Pulse: 81  Resp: 16  Temp: 98.7 F (  37.1 C)  Weight: 113 lb (51.3 kg)    Physical Exam  Nursing note and vitals reviewed. Constitutional: She is oriented to person, place, and time. She appears well-developed and well-nourished. No distress.  HENT:  Head: Normocephalic and atraumatic.  Eyes: Conjunctivae are normal. Right eye exhibits no discharge. Left eye exhibits no discharge. No scleral icterus.  Neck: Normal range of motion.  Respiratory: Effort normal. No respiratory distress.  Neurological: She is alert and oriented to person, place, and time.  Skin: She is not diaphoretic.  Psychiatric: She has a normal mood and affect. Her behavior is normal. Judgment and thought content normal.    MAU Course  Procedures Results for orders placed or performed during the hospital encounter of 02/05/18 (from the past 24 hour(s))  hCG, quantitative, pregnancy     Status: Abnormal   Collection Time: 02/05/18  3:50 PM  Result Value Ref Range   hCG, Beta Chain, Quant, S 21,028 (H) <5 mIU/mL  CBC     Status: Abnormal   Collection Time:  02/05/18  3:50 PM  Result Value Ref Range   WBC 8.2 4.0 - 10.5 K/uL   RBC 4.10 3.87 - 5.11 MIL/uL   Hemoglobin 12.1 12.0 - 15.0 g/dL   HCT 16.1 (L) 09.6 - 04.5 %   MCV 86.3 78.0 - 100.0 fL   MCH 29.5 26.0 - 34.0 pg   MCHC 34.2 30.0 - 36.0 g/dL   RDW 40.9 81.1 - 91.4 %   Platelets 300 150 - 400 K/uL   US Ob Transvaginal  Result Date: 02/05/2018 CLINICAL DATA:  Pregnancy of unknown location.  Back pain. EXAM: US OB TRANSVAGINAL COMPARISON:  Pelvic ultrasound 01/25/2018 FINDINGS: Number of IUPs:  2 Chorionicity/Amnionicity:  Dichorionic-diamniotic (thick membrane) TWIN 1 Yolk sac:  Visualized. Embryo:  Not Visualized. Cardiac Activity: Not Visualized. MSD: 7.5 mm   5 w   3 d TWIN 2 Yolk sac:  Visualized. Embryo:  Not Visualized. Cardiac Activity: Not Visualized. MSD: 8.9 mm   5 w   4 d Subchorionic hemorrhage:  None visualized. Maternal uterus/adnexae: Normal left ovary. Within the right ovary there is a 3.6 x 4.0 x 3.7 cm cyst with internal complexity, potentially retracting clot. Small amount of free fluid in the pelvis. IMPRESSION: Findings compatible with dichorionic diamniotic twin gestation. Recommend follow-up quantitative B-HCG levels and follow-up US in 14 days to assess viability. This recommendation follows SRU consensus guidelines: Diagnostic Criteria for Nonviable Pregnancy Early in the First Trimester. Malva Limes Med 2013; 782:9562-13. Large cyst with internal complexity within the right ovary, potentially representing a hemorrhagic cyst. Recommend attention on follow-up ultrasound in 14 days. Electronically Signed   By: Annia Belt M.D.   On: 02/05/2018 17:07    MDM Ultrasound shows twin IUGS with YS x 2. Persistent right ovarian cyst, size c/w last ultrasound Reviewed results with patient.   Assessment and Plan   1. Dichorionic diamniotic twin pregnancy in first trimester   2. Abdominal pain during pregnancy in first trimester    P: Discharge home Start prenatal care -- given  list of providers Discussed reasons to return to MAU    Judeth Horn, NP 02/05/2018 5:13 PM

## 2018-02-10 ENCOUNTER — Encounter (HOSPITAL_COMMUNITY): Payer: Self-pay | Admitting: *Deleted

## 2018-02-10 ENCOUNTER — Inpatient Hospital Stay (HOSPITAL_COMMUNITY)
Admission: AD | Admit: 2018-02-10 | Discharge: 2018-02-10 | Disposition: A | Discharge status: Home / Self Care | Payer: Medicaid Other | Source: Ambulatory Visit | Attending: Obstetrics & Gynecology | Admitting: Obstetrics & Gynecology

## 2018-02-10 DIAGNOSIS — O26891 Other specified pregnancy related conditions, first trimester: Secondary | ICD-10-CM | POA: Insufficient documentation

## 2018-02-10 DIAGNOSIS — Z3A01 Less than 8 weeks gestation of pregnancy: Secondary | ICD-10-CM | POA: Diagnosis not present

## 2018-02-10 DIAGNOSIS — Z9889 Other specified postprocedural states: Secondary | ICD-10-CM | POA: Diagnosis not present

## 2018-02-10 DIAGNOSIS — Z833 Family history of diabetes mellitus: Secondary | ICD-10-CM | POA: Insufficient documentation

## 2018-02-10 DIAGNOSIS — Z79899 Other long term (current) drug therapy: Secondary | ICD-10-CM | POA: Insufficient documentation

## 2018-02-10 DIAGNOSIS — R109 Unspecified abdominal pain: Secondary | ICD-10-CM | POA: Diagnosis present

## 2018-02-10 DIAGNOSIS — O219 Vomiting of pregnancy, unspecified: Secondary | ICD-10-CM | POA: Diagnosis not present

## 2018-02-10 LAB — CBC WITH DIFFERENTIAL/PLATELET
BASOS ABS: 0 10*3/uL (ref 0.0–0.1)
Basophils Relative: 0 %
EOS PCT: 2 %
Eosinophils Absolute: 0.1 10*3/uL (ref 0.0–0.7)
HEMATOCRIT: 33.4 % — AB (ref 36.0–46.0)
Hemoglobin: 11.6 g/dL — ABNORMAL LOW (ref 12.0–15.0)
LYMPHS PCT: 21 %
Lymphs Abs: 1.7 10*3/uL (ref 0.7–4.0)
MCH: 29.8 pg (ref 26.0–34.0)
MCHC: 34.7 g/dL (ref 30.0–36.0)
MCV: 85.9 fL (ref 78.0–100.0)
MONO ABS: 0.3 10*3/uL (ref 0.1–1.0)
Monocytes Relative: 3 %
NEUTROS ABS: 6 10*3/uL (ref 1.7–7.7)
Neutrophils Relative %: 74 %
PLATELETS: 295 10*3/uL (ref 150–400)
RBC: 3.89 MIL/uL (ref 3.87–5.11)
RDW: 13.6 % (ref 11.5–15.5)
WBC: 8.1 10*3/uL (ref 4.0–10.5)

## 2018-02-10 LAB — COMPREHENSIVE METABOLIC PANEL
ALT: 13 U/L (ref 0–44)
AST: 19 U/L (ref 15–41)
Albumin: 3.7 g/dL (ref 3.5–5.0)
Alkaline Phosphatase: 57 U/L (ref 38–126)
Anion gap: 9 (ref 5–15)
BILIRUBIN TOTAL: 1.1 mg/dL (ref 0.3–1.2)
BUN: 9 mg/dL (ref 6–20)
CO2: 24 mmol/L (ref 22–32)
CREATININE: 0.6 mg/dL (ref 0.44–1.00)
Calcium: 8.6 mg/dL — ABNORMAL LOW (ref 8.9–10.3)
Chloride: 100 mmol/L (ref 98–111)
Glucose, Bld: 93 mg/dL (ref 70–99)
POTASSIUM: 3.7 mmol/L (ref 3.5–5.1)
Sodium: 133 mmol/L — ABNORMAL LOW (ref 135–145)
TOTAL PROTEIN: 6.7 g/dL (ref 6.5–8.1)

## 2018-02-10 LAB — URINALYSIS, ROUTINE W REFLEX MICROSCOPIC
Bilirubin Urine: NEGATIVE
GLUCOSE, UA: NEGATIVE mg/dL
HGB URINE DIPSTICK: NEGATIVE
Ketones, ur: 5 mg/dL — AB
LEUKOCYTES UA: NEGATIVE
Nitrite: NEGATIVE
PROTEIN: NEGATIVE mg/dL
Specific Gravity, Urine: 1.012 (ref 1.005–1.030)
pH: 7 (ref 5.0–8.0)

## 2018-02-10 MED ORDER — FAMOTIDINE 20 MG PO TABS: 20.0000 mg | ORAL_TABLET | Freq: Two times a day (BID) | ORAL | 1 refills | Status: AC

## 2018-02-10 MED ORDER — PROMETHAZINE HCL 12.5 MG PO TABS: 12.5000 mg | ORAL_TABLET | Freq: Four times a day (QID) | ORAL | 0 refills | Status: DC | PRN

## 2018-02-10 NOTE — MAU Note (Signed)
Pt stated she woke up this morning and had a sharp pain in her upper abd. That lasted for about 2 hours. It was difficult for hr to even move. Pain is gone now but is still having some cramping in her lower abd.. Denies any vaginal bleeding at this time.  Also c/o n/v ain the morning. Feels like her appitine is less than it use to be.

## 2018-02-10 NOTE — MAU Provider Note (Addendum)
History     CSN: [id number]  Arrival date and time: 02/10/18 1544   First Provider Initiated Contact with Patient 02/10/18 1601      Chief Complaint  Patient presents with  . Abdominal Pain   HPI Shannon Peters is a 22 y.o. G2P1001 at 4045w1d with di/di twins who presents to MAU today with complaint of sharp upper abdominal pain this morning that resolved. The patient has had N/V in the mornings as well as decreased appetite recently. She denies heartburn. She has not taken anything for pain or nausea. She has continued to have occasional low back pain. She denies bleeding, discharge. She had negative infection testing in the last few weeks in MAU.   OB History    Gravida  2   Para  1   Term  1   Preterm      AB      Living  1     SAB      TAB      Ectopic      Multiple      Live Births  1           Past Medical History:  Diagnosis Date  . Medical history non-contributory     Past Surgical History:  Procedure Laterality Date  . ANTERIOR CRUCIATE LIGAMENT REPAIR Right     Family History  Problem Relation Age of Onset  . Diabetes Maternal Aunt   . Diabetes Maternal Uncle   . Diabetes Maternal Grandmother   . Diabetes Maternal Grandfather     Social History   Tobacco Use  . Smoking status: Never Smoker  . Smokeless tobacco: Never Used  Substance Use Topics  . Alcohol use: No  . Drug use: No    Allergies: No Known Allergies  Medications Prior to Admission  Medication Sig Dispense Refill Last Dose  . Prenatal Vit-Fe Fumarate-FA (MULTIVITAMIN-PRENATAL) 27-0.8 MG TABS tablet Take 1 tablet by mouth daily at 12 noon.   07/25/2015 at Unknown time    Review of Systems  Constitutional: Negative for fever.  Gastrointestinal: Positive for abdominal pain, nausea and vomiting. Negative for constipation and diarrhea.  Genitourinary: Negative for dysuria, frequency, urgency, vaginal bleeding and vaginal discharge.   Physical Exam   Blood pressure  (!) 99/57, pulse 84, temperature 98.3 F (36.8 C), resp. rate 18, height 5\' 4"  (1.626 m), weight 112 lb (50.8 kg), last menstrual period 12/29/2017, unknown if currently breastfeeding.  Physical Exam  Nursing note and vitals reviewed. Constitutional: She is oriented to person, place, and time. She appears well-developed and well-nourished. No distress.  HENT:  Head: Normocephalic and atraumatic.  Cardiovascular: Normal rate.  Respiratory: Effort normal.  GI: Soft. She exhibits no distension and no mass. There is no tenderness. There is no rebound and no guarding.  Neurological: She is alert and oriented to person, place, and time.  Skin: Skin is warm and dry. No erythema.  Psychiatric: She has a normal mood and affect.     Results for orders placed or performed during the hospital encounter of 02/10/18 (from the past 24 hour(s))  Urinalysis, Routine w reflex microscopic     Status: Abnormal   Collection Time: 02/10/18  4:13 PM  Result Value Ref Range   Color, Urine YELLOW YELLOW   APPearance CLEAR CLEAR   Specific Gravity, Urine 1.012 1.005 - 1.030   pH 7.0 5.0 - 8.0   Glucose, UA NEGATIVE NEGATIVE mg/dL   Hgb urine dipstick NEGATIVE NEGATIVE  Bilirubin Urine NEGATIVE NEGATIVE   Ketones, ur 5 (A) NEGATIVE mg/dL   Protein, ur NEGATIVE NEGATIVE mg/dL   Nitrite NEGATIVE NEGATIVE   Leukocytes, UA NEGATIVE NEGATIVE  CBC with Differential/Platelet     Status: Abnormal   Collection Time: 02/10/18  4:53 PM  Result Value Ref Range   WBC 8.1 4.0 - 10.5 K/uL   RBC 3.89 3.87 - 5.11 MIL/uL   Hemoglobin 11.6 (L) 12.0 - 15.0 g/dL   HCT 16.1 (L) 09.6 - 04.5 %   MCV 85.9 78.0 - 100.0 fL   MCH 29.8 26.0 - 34.0 pg   MCHC 34.7 30.0 - 36.0 g/dL   RDW 40.9 81.1 - 91.4 %   Platelets 295 150 - 400 K/uL   Neutrophils Relative % 74 %   Neutro Abs 6.0 1.7 - 7.7 K/uL   Lymphocytes Relative 21 %   Lymphs Abs 1.7 0.7 - 4.0 K/uL   Monocytes Relative 3 %   Monocytes Absolute 0.3 0.1 - 1.0 K/uL    Eosinophils Relative 2 %   Eosinophils Absolute 0.1 0.0 - 0.7 K/uL   Basophils Relative 0 %   Basophils Absolute 0.0 0.0 - 0.1 K/uL     MAU Course  Procedures None  MDM UA, CBC, CMP today  Patient denies pain at this time so unable to trial medications in MAU. Will discharge with regimen of pepcid and phenergan.  Reviewed labs  Assessment and Plan  A: Twin IUP at [redacted]w[redacted]d Nausea and vomiting in pregnancy prior to [redacted] weeks gestation   P:  Discharge home Rx for Phenergan and Pepcid sent to patient's pharmacy  Diet for N/V in pregnancy and heartburn discussed Patient advised to follow-up with OB provider of choice to start prenatal care. List given.  Patient may return to MAU as needed or if her condition were to change or worsen  Lindwood Qua FNP 02/10/2018, 5:15 PM

## 2018-02-10 NOTE — Discharge Instructions (Signed)
Eating Plan for Nausea and Vomiting in pregnancy Hyperemesis gravidarum is a severe form of morning sickness. Because this condition causes severe nausea and vomiting, it can lead to dehydration, malnutrition, and weight loss. One way to lessen the symptoms of nausea and vomiting is to follow the eating plan for hyperemesis gravidarum. It is often used along with prescribed medicines to control your symptoms. What can I do to relieve my symptoms? Listen to your body. Everyone is different and has different preferences. Find what works best for you. Take any of the following actions that are helpful to you:  Eat and drink slowly.  Eat 5-6 small meals daily instead of 3 large meals.  Eat crackers before you get out of bed in the morning.  Try having a snack in the middle of the night.  Starchy foods are usually tolerated well. Examples include cereal, toast, bread, potatoes, pasta, rice, and pretzels.  Ginger may help with nausea. Add  tsp ground ginger to hot tea or choose ginger tea.  Try drinking 100% fruit juice or an electrolyte drink. An electrolyte drink contains sodium, potassium, and chloride.  Continue to take your prenatal vitamins as told by your health care provider. If you are having trouble taking your prenatal vitamins, talk with your health care provider about different options.  Include at least 1 serving of protein with your meals and snacks. Protein options include meats or poultry, beans, nuts, eggs, and yogurt. Try eating a protein-rich snack before bed. Examples of these snacks include cheese and crackers or half of a peanut butter or [place] sandwich.  Consider eliminating foods that trigger your symptoms. These may include spicy foods, coffee, high-fat foods, very sweet foods, and acidic foods.  Try meals that have more protein combined with bland, salty, lower-fat, and dry foods, such as nuts, seeds, pretzels, crackers, and cereal.  Talk with your healthcare  provider about starting a supplement of vitamin B6.  Have fluids that are cold, clear, and carbonated or sour. Examples include lemonade, ginger ale, lemon-lime soda, ice water, and sparkling water.  Try lemon or mint tea.  Try brushing your teeth or using a mouth rinse after meals.  What should I avoid to reduce my symptoms? Avoiding some of the following things may help reduce your symptoms.  Foods with strong smells. Try eating meals in well-ventilated areas that are free of odors.  Drinking water or other beverages with meals. Try not to drink anything during the 30 minutes before and after your meals.  Drinking more than 1 cup of fluid at a time. Sometimes using a straw helps.  Fried or high-fat foods, such as butter and cream sauces.  Spicy foods.  Skipping meals as best as you can. Nausea can be more intense on an empty stomach. If you cannot tolerate food at that time, do not force it. Try sucking on ice chips or other frozen items, and make up for missed calories later.  Lying down within 2 hours after eating.  Environmental triggers. These may include smoky rooms, closed spaces, rooms with strong smells, warm or humid places, overly loud and noisy rooms, and rooms with motion or flickering lights.  Quick and sudden changes in your movement.  This information is not intended to replace advice given to you by your health care provider. Make sure you discuss any questions you have with your health care provider. Document Released: 04/23/2007 Document Revised: 02/23/2016 Document Reviewed: 01/25/2016 Elsevier Interactive Patient Education  [employer]2018 Elsevier Inc.  Food choices to reduce stomach acid problems When you have gastroesophageal reflux disease (GERD), the foods you eat and your eating habits are very important. Choosing the right foods can help ease your discomfort. What guidelines do I need to follow?  Choose fruits, vegetables, whole grains, and low-fat dairy  products.  Choose low-fat meat, fish, and poultry.  Limit fats such as oils, salad dressings, butter, nuts, and avocado.  Keep a food diary. This helps you identify foods that cause symptoms.  Avoid foods that cause symptoms. These may be different for everyone.  Eat small meals often instead of 3 large meals a day.  Eat your meals slowly, in a place where you are relaxed.  Limit fried foods.  Cook foods using methods other than frying.  Avoid drinking alcohol.  Avoid drinking large amounts of liquids with your meals.  Avoid bending over or lying down until 2-3 hours after eating. What foods are not recommended? These are some foods and drinks that may make your symptoms worse: Vegetables Tomatoes. Tomato juice. Tomato and spaghetti sauce. Chili peppers. Onion and garlic. Horseradish. Fruits Oranges, grapefruit, and lemon (fruit and juice). Meats High-fat meats, fish, and poultry. This includes hot dogs, ribs, ham, sausage, salami, and bacon. Dairy Whole milk and chocolate milk. Sour cream. Cream. Butter. Ice cream. Cream cheese. Drinks Coffee and tea. Bubbly (carbonated) drinks or energy drinks. Condiments Hot sauce. Barbecue sauce. Sweets/Desserts Chocolate and cocoa. Donuts. Peppermint and spearmint. Fats and Oils High-fat foods. This includes [place] fries and potato chips. Other Vinegar. Strong spices. This includes black pepper, white pepper, red pepper, cayenne, curry powder, cloves, ginger, and chili powder. The items listed above may not be a complete list of foods and drinks to avoid. Contact your dietitian for more information. This information is not intended to replace advice given to you by your health care provider. Make sure you discuss any questions you have with your health care provider. Document Released: 12/26/2011 Document Revised: 12/02/2015 Document Reviewed: 04/30/2013 Elsevier Interactive Patient Education  2017 Elsevier Inc. Morning  Sickness Morning sickness is when you feel sick to your stomach (nauseous) during pregnancy. You may feel sick to your stomach and throw up (vomit). You may feel sick in the morning, but you can feel this way any time of day. Some women feel very sick to their stomach and cannot stop throwing up (hyperemesis gravidarum). Follow these instructions at home:  Only take medicines as told by your doctor.  Take multivitamins as told by your doctor. Taking multivitamins before getting pregnant can stop or lessen the harshness of morning sickness.  Eat dry toast or unsalted crackers before getting out of bed.  Eat 5 to 6 small meals a day.  Eat dry and bland foods like rice and baked potatoes.  Do not drink liquids with meals. Drink between meals.  Do not eat greasy, fatty, or spicy foods.  Have someone cook for you if the smell of food causes you to feel sick or throw up.  If you feel sick to your stomach after taking prenatal vitamins, take them at night or with a snack.  Eat protein when you need a snack (nuts, yogurt, cheese).  Eat unsweetened gelatins for dessert.  Wear a bracelet used for sea sickness (acupressure wristband).  Go to a doctor that puts thin needles into certain body points (acupuncture) to improve how you feel.  Do not smoke.  Use a humidifier to keep the air in your house free of odors.  Get lots of fresh air. Contact a doctor if:  You need medicine to feel better.  You feel dizzy or lightheaded.  You are losing weight. Get help right away if:  You feel very sick to your stomach and cannot stop throwing up.  You pass out (faint). This information is not intended to replace advice given to you by your health care provider. Make sure you discuss any questions you have with your health care provider. Document Released: 08/03/2004 Document Revised: 12/02/2015 Document Reviewed: 12/11/2012 Elsevier Interactive Patient Education  2017 [employer].

## 2018-04-23 ENCOUNTER — Ambulatory Visit: Payer: Medicaid Other | Admitting: Clinical

## 2018-04-23 ENCOUNTER — Encounter: Payer: Self-pay | Admitting: *Deleted

## 2018-04-23 ENCOUNTER — Ambulatory Visit (INDEPENDENT_AMBULATORY_CARE_PROVIDER_SITE_OTHER): Payer: Medicaid Other | Admitting: Advanced Practice Midwife

## 2018-04-23 ENCOUNTER — Encounter: Payer: Self-pay | Admitting: Advanced Practice Midwife

## 2018-04-23 ENCOUNTER — Other Ambulatory Visit (HOSPITAL_COMMUNITY)
Admission: RE | Admit: 2018-04-23 | Discharge: 2018-04-23 | Disposition: A | Discharge status: Home / Self Care | Payer: Medicaid Other | Source: Ambulatory Visit | Attending: Advanced Practice Midwife | Admitting: Advanced Practice Midwife

## 2018-04-23 DIAGNOSIS — O0992 Supervision of high risk pregnancy, unspecified, second trimester: Secondary | ICD-10-CM | POA: Diagnosis not present

## 2018-04-23 DIAGNOSIS — O0990 Supervision of high risk pregnancy, unspecified, unspecified trimester: Secondary | ICD-10-CM | POA: Insufficient documentation

## 2018-04-23 DIAGNOSIS — Z3A16 16 weeks gestation of pregnancy: Secondary | ICD-10-CM | POA: Diagnosis not present

## 2018-04-23 DIAGNOSIS — O0993 Supervision of high risk pregnancy, unspecified, third trimester: Secondary | ICD-10-CM | POA: Insufficient documentation

## 2018-04-23 MED ORDER — PROMETHAZINE HCL 12.5 MG PO TABS: 12.5000 mg | ORAL_TABLET | Freq: Four times a day (QID) | ORAL | 2 refills | Status: DC | PRN

## 2018-04-23 MED ORDER — PRENATAL VITAMINS 0.8 MG PO TABS: 1.0000 | ORAL_TABLET | Freq: Every day | ORAL | 12 refills | Status: AC

## 2018-04-23 NOTE — Patient Instructions (Signed)
Places to have your son circumcised:    [facility] [phone] $480 while you are in hospital  [facility] [phone] $244 by 4 wks  [facility] [phone] $[phone] by 2 wks  [facility] [phone] $250 by 7 days [facility] [phone] $[phone] by 4 wks  These prices sometimes change but are roughly what you can expect to pay. Please call and confirm pricing.   Circumcision is considered an elective/non-medically necessary procedure. There are many reasons parents decide to have their sons circumsized. During the first year of life circumcised males have a reduced risk of urinary tract infections but after this year the rates between circumcised males and uncircumcised males are the same.  It is safe to have your son circumcised outside of the hospital and the places above perform them regularly.   Deciding about Circumcision in Baby Boys  (Up-to-date The Basics)  What is circumcision?  Circumcision is a surgery that removes the skin that covers the tip of the penis, called the "foreskin" Circumcision is usually done when a boy is between 1 and 10 days old. In the [place], circumcision is common. In some other countries, fewer boys are circumcised. Circumcision is a common tradition in some religions.  Should I have my baby boy circumcised?  There is no easy answer. Circumcision has some benefits. But it also has risks. After talking with your doctor, you will have to decide for yourself what is right for your family.  What are the benefits of circumcision?  Circumcised boys seem to have slightly lower rates of: ?Urinary tract infections ?Swelling of the opening at the tip of the penis Circumcised men seem to have slightly lower rates of: ?Urinary tract infections ?Swelling of the opening at the tip of the penis ?Penis  cancer ?HIV and other infections that you catch during sex ?Cervical cancer in the women they have sex with Even so, in the [place], the risks of these problems are small - even in boys and men who have not been circumcised. Plus, boys and men who are not circumcised can reduce these extra risks by: ?Cleaning their penis well ?Using condoms during sex  What are the risks of circumcision?  Risks include: ?Bleeding or infection from the surgery ?Damage to or amputation of the penis ?A chance that the doctor will cut off too much or not enough of the foreskin ?A chance that sex won't feel as good later in life Only about 1 out of every 200 circumcisions leads to problems. There is also a chance that your health insurance won't pay for circumcision.  How is circumcision done in baby boys?  First, the baby gets medicine for pain relief. This might be a cream on the skin or a shot into the base of the penis. Next, the doctor cleans the baby's penis well. Then he or she uses special tools to cut off the foreskin. Finally, the doctor wraps a bandage (called gauze) around the baby's penis. If you have your baby circumcised, his doctor or nurse will give you instructions on how to care for him after the surgery. It is important that you follow those instructions carefully. AREA PEDIATRIC/FAMILY PRACTICE PHYSICIANS  [facility] [address], [address] [place], [place]  [address] Phone - [phone]   Fax - [phone]  [facility] [address] [place], [place] [address] Phone - [phone]   Fax - [phone]  [facility] [address] [place], [place]    [address] Phone - [phone]   Fax - [phone]  [facility] [address] [place], [place]  [address] Phone - [phone]   Fax - [phone]  [facility] [address] [place], [place]  [address] Phone - [phone]   Fax -  [phone]  [facility] [address] [place], [place]  [address] Phone - [phone]   Fax - [phone]  [facility] [address] [place], [place]  [address] Phone - [phone]   Fax - [phone]  [facility] [address], [address] [place], [place]  [address] Phone - [phone]   Fax - [phone]  [facility] [address] [place], [place]  [address] Phone - [phone]   Fax - [phone] [facility] [address] [place], [place]  [address] Phone - [phone]   Fax - [phone]  [facility] [address] [place], [place]  [address] Phone - [phone]   Fax - [phone]  [facility] [address] [place], [place]  [address] Phone - [phone]   Fax - [phone]  [facility] [address], [address] [place], [place]  [address] Phone - [phone]   Fax - [phone]  [facility] GOSR[facility] [address] [place], [place]  [address] Phone - [phone]  [facility] [address] [place], [place]  [address] Phone - [phone]   Fax - [phone]  [facility] [address] [place], [place]  [address] Phone - [phone]   Fax - [phone]  [facility] [address] [place], [place]  [address] Phone - [phone]   Fax - [phone]  [facility] [address] [place], [place]  [address] Phone - [phone]   Fax - [phone]   [facility] [address] [place], [place]  [address] Phone - [phone]   Fax - [phone]  [facility] [address] [place], [place]  [address] Phone - [phone]   Fax - [phone]  [facility] RUB[facility] [address] [place], [place]  [address] Phone - [phone]   Fax - [phone]  [facility] [address] [place], [place]  [address] Phone - [phone]   Fax - [phone]  [facility] [address] [place], [place]  [address] Phone - [phone]   Fax - [phone] [facility] [address] [place], [place]  [address] Phone - [phone]   Fax - [phone]  [facility] [address] [place], [place]  [address] Phone - [phone]   Fax - [phone]  [facility] [address] [place], [place]  [address] Phone - [phone]   Fax - [phone]  [facility] Charlene Flemming MD [address] [place] [address] Phone [phone]  Fax [phone]   

## 2018-04-23 NOTE — BH Specialist Note (Signed)
Integrated Behavioral Health Initial Visit  MRN: [id number] Name: Shannon Peters  Number of Integrated Behavioral Health Clinician visits:: 1/6 Session Start time: 1:47  Session End time: 1:50 Total time: 3 minute  Type of Service: Integrated Behavioral Health- Individual/Family Interpretor:No. Interpretor Name and Language: n/a   Warm Hand Off Completed.       SUBJECTIVE: Shannon Peters is a 22 y.o. female accompanied by n/a Patient was referred by Sharen Counter, CNM for Initial OB introduction to integrated behavioral health services . Patient reports the following symptoms/concerns: Pt states no particular concerns today.  Duration of problem: n/a; Severity of problem: n/a  OBJECTIVE: Mood: Normal and Affect: Appropriate Risk of harm to self or others: No plan to harm self or others  LIFE CONTEXT: Family and Social: - School/Work: - Self-Care: - Life Changes: Current pregnancy with twins  GOALS ADDRESSED:n/a  INTERVENTIONS: Standardized Assessments completed: GAD-7 and PHQ 9  ASSESSMENT: Patient currently experiencing Supervision of high risk pregnancy, antepartum.   Patient may benefit from Initial OB introduction to integrated behavioral health services .  PLAN: 1. Follow up with behavioral health clinician on : As needed  2. Behavioral recommendations: n/a 3. Referral(s): Integrated [employer] (In Clinic) 4. "From scale of 1-10, how likely are you to follow plan?": 10  Rae Lips, LCSW  Depression screen [facility] 2/9 04/23/2018  Decreased Interest 2  Down, Depressed, Hopeless 0  PHQ - 2 Score 2  Altered sleeping 3  Change in appetite 3  Feeling bad or failure about yourself  0  Trouble concentrating 0  Moving slowly or fidgety/restless 0  Suicidal thoughts 0  PHQ-9 Score 8   GAD 7 : Generalized Anxiety Score 04/23/2018  Nervous, Anxious, on Edge 0  Control/stop worrying 0  Worry too much - different things 0  Trouble  relaxing 0  Restless 0  Easily annoyed or irritable 1  Afraid - awful might happen 0  Total GAD 7 Score 1

## 2018-04-23 NOTE — Progress Notes (Signed)
   PRENATAL VISIT NOTE  Subjective:  Shannon Peters is a 22 y.o. G2P1001 at [redacted]w[redacted]d being seen today for initial prenatal visit.  She is currently monitored for the following issues for this low-risk pregnancy and has Pregnancy of unknown anatomic location and Supervision of high risk pregnancy, antepartum on their problem list.  Patient reports some intermittent LLQ abdominal pain starting today, occurred x 2 episodes lasting 5-10 minutes. Pt reports bowel movements every 3-4 days, which is usual for her.  Also had nausea/vomiting today when this had been improved x 2 weeks..  Contractions: Not present. Vag. Bleeding: None.  Movement: Present. Denies leaking of fluid.   The following portions of the patient's history were reviewed and updated as appropriate: allergies, current medications, past family history, past medical history, past social history, past surgical history and problem list. Problem list updated.  Objective:   Vitals:   04/23/18 1333  BP: (!) 97/52  Pulse: 80  Weight: 51.8 kg    Fetal Status: Fetal Heart Rate (bpm): 141/143   Movement: Present     VS reviewed, nursing note reviewed,  Constitutional: well developed, well nourished, no distress HEENT: normocephalic CV: normal rate Pulm/chest wall: normal effort Breast Exam: Deferred Abdomen: soft Neuro: alert and oriented x 3 Skin: warm, dry Psych: affect normal Pelvic exam: Cervix pink, visually closed, without lesion, scant white creamy discharge, vaginal walls and external genitalia normal Bimanual exam: Cervix 0/long/high, firm, anterior, neg CMT, uterus nontender, nonenlarged, adnexa without tenderness, enlargement, or mass  Assessment and Plan:  Pregnancy: G2P1001 at [redacted]w[redacted]d  1. Supervision of high risk pregnancy, antepartum --Anticipatory guidance about next visits/weeks of pregnancy given. --Discussed and offered genetic screening options, including Quad screen/AFP, NIPS testing, and option to decline  testing. Benefits/risks/alternatives reviewed. Pt aware that anatomy US is form of genetic screening with lower accuracy in detecting trisomies than blood work.  Pt would like NIPS and AFP for genetic screening today. - Cytology - PAP - Cystic fibrosis gene test - Culture, OB Urine - [facility] AMB BABYSCRIPTS OPT IN - Hemoglobinopathy Evaluation - Obstetric Panel, Including HIV - SMN1 COPY NUMBER ANALYSIS (SMA Carrier Screen) - [place] MFM OB COMP + 14 WK; Future  2. Abdominal pain in pregnancy --No evidence of preterm labor with closed cervix.  Most likely digestive pain, r/t constipation and/or vomiting.  Discussed dietary changes, increased PO fluids and fiber.  Renewed Rx for Phenergan to use PRN. --Warning signs/reasons to seek care reviewed.  Preterm labor symptoms and general obstetric precautions including but not limited to vaginal bleeding, contractions, leaking of fluid and fetal movement were reviewed in detail with the patient. Please refer to After Visit Summary for other counseling recommendations.  No follow-ups on file.  Future Appointments  Date Time Provider Department Center  04/23/2018  2:20 PM [facility]  05/08/2018  1:30 PM [facility] [place] 1 [facility]    Sharen Counter, CNM

## 2018-04-25 LAB — CYTOLOGY - PAP
CHLAMYDIA, DNA PROBE: NEGATIVE
NEISSERIA GONORRHEA: NEGATIVE

## 2018-04-26 LAB — CULTURE, OB URINE

## 2018-04-26 LAB — URINE CULTURE, OB REFLEX

## 2018-04-29 ENCOUNTER — Encounter: Payer: Self-pay | Admitting: Advanced Practice Midwife

## 2018-04-29 ENCOUNTER — Telehealth: Payer: Self-pay | Admitting: Advanced Practice Midwife

## 2018-04-29 DIAGNOSIS — R87619 Unspecified abnormal cytological findings in specimens from cervix uteri: Secondary | ICD-10-CM | POA: Insufficient documentation

## 2018-04-29 NOTE — Telephone Encounter (Signed)
Called pt to inform her about abnormal Pap, need for repeat in 1 year.  Pt states understanding.

## 2018-05-01 ENCOUNTER — Encounter (HOSPITAL_COMMUNITY): Payer: Self-pay

## 2018-05-02 LAB — OBSTETRIC PANEL, INCLUDING HIV
Antibody Screen: NEGATIVE
BASOS ABS: 0.1 10*3/uL (ref 0.0–0.2)
Basos: 1 %
EOS (ABSOLUTE): 0.2 10*3/uL (ref 0.0–0.4)
Eos: 2 %
HIV SCREEN 4TH GENERATION: NONREACTIVE
Hematocrit: 34 % (ref 34.0–46.6)
Hemoglobin: 11.5 g/dL (ref 11.1–15.9)
Hepatitis B Surface Ag: NEGATIVE
Immature Grans (Abs): 0.1 10*3/uL (ref 0.0–0.1)
Immature Granulocytes: 1 %
LYMPHS ABS: 1.4 10*3/uL (ref 0.7–3.1)
Lymphs: 16 %
MCH: 30 pg (ref 26.6–33.0)
MCHC: 33.8 g/dL (ref 31.5–35.7)
MCV: 89 fL (ref 79–97)
MONOS ABS: 0.4 10*3/uL (ref 0.1–0.9)
Monocytes: 5 %
NEUTROS ABS: 6.2 10*3/uL (ref 1.4–7.0)
Neutrophils: 75 %
PLATELETS: 276 10*3/uL (ref 150–450)
RBC: 3.83 x10E6/uL (ref 3.77–5.28)
RDW: 14 % (ref 12.3–15.4)
RPR Ser Ql: NONREACTIVE
Rh Factor: POSITIVE
Rubella Antibodies, IGG: 2.95 index (ref >0.99)
WBC: 8.3 10*3/uL (ref 3.4–10.8)

## 2018-05-02 LAB — SMN1 COPY NUMBER ANALYSIS (SMA CARRIER SCREENING)

## 2018-05-02 LAB — CYSTIC FIBROSIS GENE TEST

## 2018-05-02 LAB — HEMOGLOBINOPATHY EVALUATION
Ferritin: 12 ng/mL — ABNORMAL LOW (ref 15–150)
HGB A2 QUANT: 2.3 % (ref 1.8–3.2)
HGB A: 97.7 % (ref 96.4–98.8)
HGB C: 0 %
HGB F QUANT: 0 % (ref 0.0–2.0)
HGB S: 0 %
HGB VARIANT: 0 %
Hgb Solubility: NEGATIVE

## 2018-05-03 ENCOUNTER — Encounter (HOSPITAL_COMMUNITY): Payer: Self-pay

## 2018-05-03 ENCOUNTER — Other Ambulatory Visit: Payer: Self-pay

## 2018-05-03 ENCOUNTER — Inpatient Hospital Stay (HOSPITAL_COMMUNITY)
Admission: AD | Admit: 2018-05-03 | Discharge: 2018-05-03 | Disposition: A | Discharge status: Home / Self Care | Payer: Medicaid Other | Source: Ambulatory Visit | Attending: Obstetrics and Gynecology | Admitting: Obstetrics and Gynecology

## 2018-05-03 DIAGNOSIS — O30042 Twin pregnancy, dichorionic/diamniotic, second trimester: Secondary | ICD-10-CM | POA: Diagnosis not present

## 2018-05-03 DIAGNOSIS — K5900 Constipation, unspecified: Secondary | ICD-10-CM | POA: Diagnosis not present

## 2018-05-03 DIAGNOSIS — O26892 Other specified pregnancy related conditions, second trimester: Secondary | ICD-10-CM | POA: Insufficient documentation

## 2018-05-03 DIAGNOSIS — N898 Other specified noninflammatory disorders of vagina: Secondary | ICD-10-CM | POA: Insufficient documentation

## 2018-05-03 DIAGNOSIS — Z3A17 17 weeks gestation of pregnancy: Secondary | ICD-10-CM | POA: Diagnosis not present

## 2018-05-03 DIAGNOSIS — O99612 Diseases of the digestive system complicating pregnancy, second trimester: Secondary | ICD-10-CM

## 2018-05-03 DIAGNOSIS — O2242 Hemorrhoids in pregnancy, second trimester: Secondary | ICD-10-CM | POA: Diagnosis not present

## 2018-05-03 LAB — WET PREP, GENITAL
Clue Cells Wet Prep HPF POC: NONE SEEN
SPERM: NONE SEEN
Trich, Wet Prep: NONE SEEN
Yeast Wet Prep HPF POC: NONE SEEN

## 2018-05-03 MED ORDER — TERCONAZOLE 0.8 % VA CREA: 1.0000 | TOPICAL_CREAM | Freq: Every day | VAGINAL | 0 refills | Status: DC

## 2018-05-03 MED ORDER — DOCUSATE SODIUM 100 MG PO CAPS: 100.0000 mg | ORAL_CAPSULE | Freq: Two times a day (BID) | ORAL | 0 refills | Status: AC

## 2018-05-03 NOTE — Discharge Instructions (Signed)
Constipation, Adult Constipation is when a person has fewer bowel movements in a week than normal, has difficulty having a bowel movement, or has stools that are dry, hard, or larger than normal. Constipation may be caused by an underlying condition. It may become worse with age if a person takes certain medicines and does not take in enough fluids. Follow these instructions at home: Eating and drinking   Eat foods that have a lot of fiber, such as fresh fruits and vegetables, whole grains, and beans.  Limit foods that are high in fat, low in fiber, or overly processed, such as french fries, hamburgers, cookies, candies, and soda.  Drink enough fluid to keep your urine clear or pale yellow. General instructions  Exercise regularly or as told by your health care provider.  Go to the restroom when you have the urge to go. Do not hold it in.  Take over-the-counter and prescription medicines only as told by your health care provider. These include any fiber supplements.  Practice pelvic floor retraining exercises, such as deep breathing while relaxing the lower abdomen and pelvic floor relaxation during bowel movements.  Watch your condition for any changes.  Keep all follow-up visits as told by your health care provider. This is important. Contact a health care provider if:  You have pain that gets worse.  You have a fever.  You do not have a bowel movement after 4 days.  You vomit.  You are not hungry.  You lose weight.  You are bleeding from the anus.  You have thin, pencil-like stools. Get help right away if:  You have a fever and your symptoms suddenly get worse.  You leak stool or have blood in your stool.  Your abdomen is bloated.  You have severe pain in your abdomen.  You feel dizzy or you faint. This information is not intended to replace advice given to you by your health care provider. Make sure you discuss any questions you have with your health care  provider. Document Released: 03/24/2004 Document Revised: 01/14/2016 Document Reviewed: 12/15/2015 Elsevier Interactive Patient Education  2018 [employer].    Hemorrhoids Hemorrhoids are swollen veins in and around the rectum or anus. There are two types of hemorrhoids:  Internal hemorrhoids. These occur in the veins that are just inside the rectum. They may poke through to the outside and become irritated and painful.  External hemorrhoids. These occur in the veins that are outside of the anus and can be felt as a painful swelling or hard lump near the anus.  Most hemorrhoids do not cause serious problems, and they can be managed with home treatments such as diet and lifestyle changes. If home treatments do not help your symptoms, procedures can be done to shrink or remove the hemorrhoids. What are the causes? This condition is caused by increased pressure in the anal area. This pressure may result from various things, including:  Constipation.  Straining to have a bowel movement.  Diarrhea.  Pregnancy.  Obesity.  Sitting for long periods of time.  Heavy lifting or other activity that causes you to strain.  Anal sex.  What are the signs or symptoms? Symptoms of this condition include:  Pain.  Anal itching or irritation.  Rectal bleeding.  Leakage of stool (feces).  Anal swelling.  One or more lumps around the anus.  How is this diagnosed? This condition can often be diagnosed through a visual exam. Other exams or tests may also be done, such as:  Examination of the rectal area with a gloved hand (digital rectal exam).  Examination of the anal canal using a small tube (anoscope).  A blood test, if you have lost a significant amount of blood.  A test to look inside the colon (sigmoidoscopy or colonoscopy).  How is this treated? This condition can usually be treated at home. However, various procedures may be done if dietary changes, lifestyle changes, and  other home treatments do not help your symptoms. These procedures can help make the hemorrhoids smaller or remove them completely. Some of these procedures involve surgery, and others do not. Common procedures include:  Rubber band ligation. Rubber bands are placed at the base of the hemorrhoids to cut off the blood supply to them.  Sclerotherapy. Medicine is injected into the hemorrhoids to shrink them.  Infrared coagulation. A type of light energy is used to get rid of the hemorrhoids.  Hemorrhoidectomy surgery. The hemorrhoids are surgically removed, and the veins that supply them are tied off.  Stapled hemorrhoidopexy surgery. A circular stapling device is used to remove the hemorrhoids and use staples to cut off the blood supply to them.  Follow these instructions at home: Eating and drinking  Eat foods that have a lot of fiber in them, such as whole grains, beans, nuts, fruits, and vegetables. Ask your health care provider about taking products that have added fiber (fiber supplements).  Drink enough fluid to keep your urine clear or pale yellow. Managing pain and swelling  Take warm sitz baths for 20 minutes, 3-4 times a day to ease pain and discomfort.  If directed, apply ice to the affected area. Using ice packs between sitz baths may be helpful. ? Put ice in a plastic bag. ? Place a towel between your skin and the bag. ? Leave the ice on for 20 minutes, 2-3 times a day. General instructions  Take over-the-counter and prescription medicines only as told by your health care provider.  Use medicated creams or suppositories as told.  Exercise regularly.  Go to the bathroom when you have the urge to have a bowel movement. Do not wait.  Avoid straining to have bowel movements.  Keep the anal area dry and clean. Use wet toilet paper or moist towelettes after a bowel movement.  Do not sit on the toilet for long periods of time. This increases blood pooling and pain. Contact  a health care provider if:  You have increasing pain and swelling that are not controlled by treatment or medicine.  You have uncontrolled bleeding.  You have difficulty having a bowel movement, or you are unable to have a bowel movement.  You have pain or inflammation outside the area of the hemorrhoids. This information is not intended to replace advice given to you by your health care provider. Make sure you discuss any questions you have with your health care provider. Document Released: 06/23/2000 Document Revised: 11/24/2015 Document Reviewed: 03/10/2015 Elsevier Interactive Patient Education  [employer].

## 2018-05-03 NOTE — MAU Provider Note (Signed)
Chief Complaint: Vaginal Itching; Vaginal Discharge; and Vaginal Pain   First Provider Initiated Contact with Patient 05/03/18 1456     SUBJECTIVE HPI: Shannon Peters is a 22 y.o. G2P1001 at [redacted]w[redacted]d who presents to [facility] reporting vaginal irritation and swelling. Has noticed thick vaginal discharge & vaginal itching. Also feels like her labia are swollen.  Has been having constipation. States she noticed a hemorrhoid and increase in vaginal swelling after bearing down for a BM a few days ago.  Denies vaginal bleeding or abdominal pain.   Past Medical History:  Diagnosis Date  . Medical history non-contributory    OB History  Gravida Para Term Preterm AB Living  2 1 1     1   SAB TAB Ectopic Multiple Live Births          1    # Outcome Date GA Lbr Len/2nd Weight Sex Delivery Anes PTL Lv  2 Current           1 Term 2017     Vag-Forceps   LIV   Past Surgical History:  Procedure Laterality Date  . ANTERIOR CRUCIATE LIGAMENT REPAIR Right    Social History   Socioeconomic History  . Marital status: Single    Spouse name: Not on file  . Number of children: Not on file  . Years of education: Not on file  . Highest education level: Not on file  Occupational History  . Not on file  Social Needs  . Financial resource strain: Not on file  . Food insecurity:    Worry: Not on file    Inability: Not on file  . Transportation needs:    Medical: Not on file    Non-medical: Not on file  Tobacco Use  . Smoking status: Never Smoker  . Smokeless tobacco: Never Used  Substance and Sexual Activity  . Alcohol use: No  . Drug use: No  . Sexual activity: Yes    Birth control/protection: None  Lifestyle  . Physical activity:    Days per week: Not on file    Minutes per session: Not on file  . Stress: Not on file  Relationships  . Social connections:    Talks on phone: Not on file    Gets together: Not on file    Attends religious service: Not on file    Active member  of club or organization: Not on file    Attends meetings of clubs or organizations: Not on file    Relationship status: Not on file  . Intimate partner violence:    Fear of current or ex partner: Not on file    Emotionally abused: Not on file    Physically abused: Not on file    Forced sexual activity: Not on file  Other Topics Concern  . Not on file  Social History Narrative  . Not on file   Family History  Problem Relation Age of Onset  . Diabetes Maternal Aunt   . Diabetes Maternal Uncle   . Diabetes Maternal Grandmother   . Diabetes Maternal Grandfather    No current facility-administered medications on file prior to encounter.    Current Outpatient Medications on File Prior to Encounter  Medication Sig Dispense Refill  . Prenatal Multivit-Min-Fe-FA (PRENATAL VITAMINS) 0.8 MG tablet Take 1 tablet by mouth daily. 30 tablet 12  . famotidine (PEPCID) 20 MG tablet Take 1 tablet (20 mg total) by mouth 2 (two) times daily. (Patient not taking: Reported on 04/23/2018) 60 tablet  1  . promethazine (PHENERGAN) 12.5 MG tablet Take 1 tablet (12.5 mg total) by mouth every 6 (six) hours as needed for nausea or vomiting. (Patient not taking: Reported on 05/03/2018) 30 tablet 2   No Known Allergies  I have reviewed patient's Past Medical Hx, Surgical Hx, Family Hx, Social Hx, medications and allergies.   Review of Systems  Gastrointestinal: Positive for constipation. Negative for abdominal pain.  Genitourinary: Positive for vaginal discharge and vaginal pain. Negative for dysuria and vaginal bleeding.    OBJECTIVE Patient Vitals for the past 24 hrs:  BP Temp Temp src Pulse Resp SpO2 Weight  05/03/18 1621 (!) 87/54 - - - - - -  05/03/18 1439 99/63 98.2 F (36.8 C) Oral 91 17 100 % 53.8 kg   Constitutional: Well-developed, well-nourished female in no acute distress.  Cardiovascular: normal rate & rhythm, no murmur Respiratory: normal rate and effort. Lung sounds clear throughout GI:  Abd soft, non-tender, Pos BS x 4. No guarding or rebound tenderness. Small flesh colored external hemorrhoid.  MS: Extremities nontender, no edema, normal ROM Neurologic: Alert and oriented x 4.  GU:     SPECULUM EXAM: NEFG, small amount of clumpy discharge BIMANUAL: No CMT. cervix closed   LAB RESULTS Results for orders placed or performed during the hospital encounter of 05/03/18 (from the past 24 hour(s))  Wet prep, genital     Status: Abnormal   Collection Time: 05/03/18  3:20 PM  Result Value Ref Range   Yeast Wet Prep HPF POC NONE SEEN NONE SEEN   Trich, Wet Prep NONE SEEN NONE SEEN   Clue Cells Wet Prep HPF POC NONE SEEN NONE SEEN   WBC, Wet Prep HPF POC MANY (A) NONE SEEN   Sperm NONE SEEN     IMAGING No results found.  MAU COURSE Orders Placed This Encounter  Procedures  . Wet prep, genital  . Discharge patient   Meds ordered this encounter  Medications  . docusate sodium (COLACE) 100 MG capsule    Sig: Take 1 capsule (100 mg total) by mouth 2 (two) times daily.    Dispense:  60 capsule    Refill:  0    Order Specific Question:   Supervising Provider    Answer:   ERVIN, MICHAEL L [[id number]]  . terconazole (TERAZOL 3) 0.8 % vaginal cream    Sig: Place 1 applicator vaginally at bedtime.    Dispense:  20 g    Refill:  0    Order Specific Question:   Supervising Provider    Answer:   Alysia Penna, MICHAEL L [[id number]]    MDM FHT present x 2 Will tx for yeast based on exam & symptoms  ASSESSMENT 1. Vaginal irritation   2. [redacted] weeks gestation of pregnancy   3. Dichorionic diamniotic twin pregnancy in second trimester   4. Hemorrhoids during pregnancy in second trimester   5. Constipation during pregnancy in second trimester     PLAN Discharge home in stable condition. Discussed constipation tx Rx colace & terazol  Allergies as of 05/03/2018   No Known Allergies     Medication List    TAKE these medications   docusate sodium 100 MG capsule Commonly known as:   COLACE Take 1 capsule (100 mg total) by mouth 2 (two) times daily.   famotidine 20 MG tablet Commonly known as:  PEPCID Take 1 tablet (20 mg total) by mouth 2 (two) times daily.   Prenatal Vitamins 0.8 MG tablet Take 1  tablet by mouth daily.   promethazine 12.5 MG tablet Commonly known as:  PHENERGAN Take 1 tablet (12.5 mg total) by mouth every 6 (six) hours as needed for nausea or vomiting.   terconazole 0.8 % vaginal cream Commonly known as:  TERAZOL 3 Place 1 applicator vaginally at bedtime.        Judeth Horn, NP 05/03/2018  10:52 PM

## 2018-05-03 NOTE — MAU Note (Signed)
Last 3-4 days, has been feeling very swollen down there, having itching and is very tender. No bleeding.  Thinks she is only feeling one of the babies moving

## 2018-05-06 ENCOUNTER — Encounter: Payer: Self-pay | Admitting: *Deleted

## 2018-05-08 ENCOUNTER — Ambulatory Visit (HOSPITAL_COMMUNITY)
Admission: RE | Admit: 2018-05-08 | Discharge: 2018-05-08 | Disposition: A | Discharge status: Home / Self Care | Payer: Medicaid Other | Source: Ambulatory Visit | Attending: Advanced Practice Midwife | Admitting: Advanced Practice Midwife

## 2018-05-08 ENCOUNTER — Other Ambulatory Visit: Payer: Self-pay | Admitting: Advanced Practice Midwife

## 2018-05-08 DIAGNOSIS — O0992 Supervision of high risk pregnancy, unspecified, second trimester: Secondary | ICD-10-CM | POA: Diagnosis not present

## 2018-05-08 DIAGNOSIS — O289 Unspecified abnormal findings on antenatal screening of mother: Secondary | ICD-10-CM

## 2018-05-08 DIAGNOSIS — Z363 Encounter for antenatal screening for malformations: Secondary | ICD-10-CM | POA: Diagnosis not present

## 2018-05-08 DIAGNOSIS — O0990 Supervision of high risk pregnancy, unspecified, unspecified trimester: Secondary | ICD-10-CM

## 2018-05-08 DIAGNOSIS — O30042 Twin pregnancy, dichorionic/diamniotic, second trimester: Secondary | ICD-10-CM | POA: Diagnosis not present

## 2018-05-08 DIAGNOSIS — Z3A18 18 weeks gestation of pregnancy: Secondary | ICD-10-CM | POA: Diagnosis not present

## 2018-05-09 ENCOUNTER — Other Ambulatory Visit (HOSPITAL_COMMUNITY): Payer: Self-pay | Admitting: *Deleted

## 2018-05-09 DIAGNOSIS — O30042 Twin pregnancy, dichorionic/diamniotic, second trimester: Secondary | ICD-10-CM

## 2018-05-21 ENCOUNTER — Ambulatory Visit (INDEPENDENT_AMBULATORY_CARE_PROVIDER_SITE_OTHER): Payer: Medicaid Other | Admitting: Student

## 2018-05-21 VITALS — BP 101/64 | HR 94 | Wt 120.4 lb

## 2018-05-21 DIAGNOSIS — O0992 Supervision of high risk pregnancy, unspecified, second trimester: Secondary | ICD-10-CM

## 2018-05-21 DIAGNOSIS — O0990 Supervision of high risk pregnancy, unspecified, unspecified trimester: Secondary | ICD-10-CM

## 2018-05-21 NOTE — Patient Instructions (Signed)
Etonogestrel implant What is this medicine? ETONOGESTREL (et oh noe JES trel) is a contraceptive (birth control) device. It is used to prevent pregnancy. It can be used for up to 3 years. This medicine may be used for other purposes; ask your health care provider or pharmacist if you have questions. COMMON BRAND NAME(S): Implanon, Nexplanon What should I tell my health care provider before I take this medicine? They need to know if you have any of these conditions: -abnormal vaginal bleeding -blood vessel disease or blood clots -cancer of the breast, cervix, or liver -depression -diabetes -gallbladder disease -headaches -heart disease or recent heart attack -high blood pressure -high cholesterol -kidney disease -liver disease -renal disease -seizures -tobacco smoker -an unusual or allergic reaction to etonogestrel, other hormones, anesthetics or antiseptics, medicines, foods, dyes, or preservatives -pregnant or trying to get pregnant -breast-feeding How should I use this medicine? This device is inserted just under the skin on the inner side of your upper arm by a health care professional. Talk to your pediatrician regarding the use of this medicine in children. Special care may be needed. Overdosage: If you think you have taken too much of this medicine contact a poison control center or emergency room at once. NOTE: This medicine is only for you. Do not share this medicine with others. What if I miss a dose? This does not apply. What may interact with this medicine? Do not take this medicine with any of the following medications: -amprenavir -bosentan -fosamprenavir This medicine may also interact with the following medications: -barbiturate medicines for inducing sleep or treating seizures -certain medicines for fungal infections like ketoconazole and itraconazole -grapefruit juice -griseofulvin -medicines to treat seizures like carbamazepine, felbamate, oxcarbazepine,  phenytoin, topiramate -modafinil -phenylbutazone -rifampin -rufinamide -some medicines to treat HIV infection like atazanavir, indinavir, lopinavir, nelfinavir, tipranavir, ritonavir -St. John's wort This list may not describe all possible interactions. Give your health care provider a list of all the medicines, herbs, non-prescription drugs, or dietary supplements you use. Also tell them if you smoke, drink alcohol, or use illegal drugs. Some items may interact with your medicine. What should I watch for while using this medicine? This product does not protect you against HIV infection (AIDS) or other sexually transmitted diseases. You should be able to feel the implant by pressing your fingertips over the skin where it was inserted. Contact your doctor if you cannot feel the implant, and use a non-hormonal birth control method (such as condoms) until your doctor confirms that the implant is in place. If you feel that the implant may have broken or become bent while in your arm, contact your healthcare provider. What side effects may I notice from receiving this medicine? Side effects that you should report to your doctor or health care professional as soon as possible: -allergic reactions like skin rash, itching or hives, swelling of the face, lips, or tongue -breast lumps -changes in emotions or moods -depressed mood -heavy or prolonged menstrual bleeding -pain, irritation, swelling, or bruising at the insertion site -scar at site of insertion -signs of infection at the insertion site such as fever, and skin redness, pain or discharge -signs of pregnancy -signs and symptoms of a blood clot such as breathing problems; changes in vision; chest pain; severe, sudden headache; pain, swelling, warmth in the leg; trouble speaking; sudden numbness or weakness of the face, arm or leg -signs and symptoms of liver injury like dark yellow or brown urine; general ill feeling or flu-like symptoms;  light-colored   stools; loss of appetite; nausea; right upper belly pain; unusually weak or tired; yellowing of the eyes or skin -unusual vaginal bleeding, discharge -signs and symptoms of a stroke like changes in vision; confusion; trouble speaking or understanding; severe headaches; sudden numbness or weakness of the face, arm or leg; trouble walking; dizziness; loss of balance or coordination Side effects that usually do not require medical attention (report to your doctor or health care professional if they continue or are bothersome): -acne -back pain -breast pain -changes in weight -dizziness -general ill feeling or flu-like symptoms -headache -irregular menstrual bleeding -nausea -sore throat -vaginal irritation or inflammation This list may not describe all possible side effects. Call your doctor for medical advice about side effects. You may report side effects to FDA at 1-800-FDA-1088. Where should I keep my medicine? This drug is given in a hospital or clinic and will not be stored at home. NOTE: This sheet is a summary. It may not cover all possible information. If you have questions about this medicine, talk to your doctor, pharmacist, or health care provider.  2018 Elsevier/Gold Standard (2016-01-13 11:19:22)  

## 2018-05-21 NOTE — Progress Notes (Signed)
   PRENATAL VISIT NOTE  Subjective:  Shannon CousinsJacqueline Cheever is a 22 y.o. G2P1001 at 223w3d being seen today for ongoing prenatal care.  She is currently monitored for the following issues for this high-risk pregnancy and has Pregnancy of unknown anatomic location; Supervision of high risk pregnancy, antepartum; and Abnormal Pap smear of cervix on their problem list.  Patient reports pelvic pressure when she walks, especially when going shopping. The pelvic pressure goes away when she's sitting or lying down.  Contractions: Not present. Vag. Bleeding: None.  Movement: Present. Denies leaking of fluid.   The following portions of the patient's history were reviewed and updated as appropriate: allergies, current medications, past family history, past medical history, past social history, past surgical history and problem list. Problem list updated.  Objective:   Vitals:   05/21/18 1004  BP: 101/64  Pulse: 94  Weight: 120 lb 6.4 oz (54.6 kg)    Fetal Status: Fetal Heart Rate (bpm): 140/144 Fundal Height: 24 cm Movement: Present     General:  Alert, oriented and cooperative. Patient is in no acute distress.  Skin: Skin is warm and dry. No rash noted.   Cardiovascular: Normal heart rate noted  Respiratory: Normal respiratory effort, no problems with respiration noted  Abdomen: Soft, gravid, appropriate for gestational age.  Pain/Pressure: Present     Pelvic: Cervical exam deferred        Extremities: Normal range of motion.  Edema: None  Mental Status: Normal mood and affect. Normal behavior. Normal judgment and thought content.   Assessment and Plan:  Pregnancy: G2P1001 at 123w3d  1. Supervision of high risk pregnancy, antepartum -Reviewed warning signs and when to come to MAU; recommend belly binder.  - AFP, Serum, Open Spina Bifida -discussed nexplanon  Preterm labor symptoms and general obstetric precautions including but not limited to vaginal bleeding, contractions, leaking of fluid and  fetal movement were reviewed in detail with the patient. Please refer to After Visit Summary for other counseling recommendations.  Return in about 4 weeks (around 06/18/2018), or ROB with MD.  Future Appointments  Date Time Provider Department Center  06/05/2018  4:00 PM [facility] US 3 [facility]    Marylene LandKathryn Lorraine Kooistra, CNM

## 2018-05-23 LAB — AFP, SERUM, OPEN SPINA BIFIDA
AFP MOM: 1.72
AFP Value: 117.8 ng/mL
Gest. Age on Collection Date: 20.3 weeks
Maternal Age At EDD: 22.9 yr
OSBR Risk 1 IN: 1551
Test Results:: NEGATIVE
Weight: 120 [lb_av]

## 2018-06-05 ENCOUNTER — Ambulatory Visit (HOSPITAL_COMMUNITY): Payer: Medicaid Other

## 2018-06-19 ENCOUNTER — Encounter: Payer: Self-pay | Admitting: Obstetrics & Gynecology

## 2018-06-21 NOTE — Progress Notes (Signed)
Transfer call:    22 yo G1 @ 22+3 by first tri u/s di/di concordant twins. with threatened preterm labor and shortened cervix diagnosed today. Cervical length 22mm. CTX q1 min. Cervix currently closed. Pt to be rechecked by provider before transport.   Pt received BMZ x1  On mag for neuro protection  GBS pending, on ppx  FHT appropriate for gestational age.   Fluid normal    Sarita HaverPettigrew, MD

## 2018-06-22 ENCOUNTER — Inpatient Hospital Stay: Admit: 2018-06-22 | Discharge: 2018-06-22 | Payer: PRIVATE HEALTH INSURANCE

## 2018-06-22 ENCOUNTER — Inpatient Hospital Stay
Admission: TF | Admit: 2018-06-22 | Discharge: 2018-06-24 | Disposition: A | Discharge status: Home / Self Care | Payer: PRIVATE HEALTH INSURANCE | Source: Intra-hospital | Attending: Student in an Organized Health Care Education/Training Program | Admitting: Student in an Organized Health Care Education/Training Program

## 2018-06-22 DIAGNOSIS — Z8759 Personal history of other complications of pregnancy, childbirth and the puerperium: Secondary | ICD-10-CM

## 2018-06-22 DIAGNOSIS — Z3A24 24 weeks gestation of pregnancy: Secondary | ICD-10-CM

## 2018-06-22 DIAGNOSIS — O30049 Twin pregnancy, dichorionic/diamniotic, unspecified trimester: Secondary | ICD-10-CM

## 2018-06-22 DIAGNOSIS — Z3490 Encounter for supervision of normal pregnancy, unspecified, unspecified trimester: Secondary | ICD-10-CM

## 2018-06-22 DIAGNOSIS — Z8659 Personal history of other mental and behavioral disorders: Secondary | ICD-10-CM

## 2018-06-22 DIAGNOSIS — O30042 Twin pregnancy, dichorionic/diamniotic, second trimester: Secondary | ICD-10-CM

## 2018-06-22 LAB — TYPE AND SCREEN
ABO/RH(D): O POS
Antibody Screen: NEGATIVE

## 2018-06-22 LAB — COMPLETE BLOOD COUNT
Hematocrit: 28.2 % — ABNORMAL LOW (ref 36–46)
Hemoglobin: 9.2 g/dL — ABNORMAL LOW (ref 12.0–15.5)
MCH: 29.7 pg (ref 26–34)
MCHC: 32.6 g/dL (ref 31–36)
MCV: 91 fL (ref 80–100)
Platelet Count: 244 10*9/L (ref 140–450)
RBC Count: 3.1 10*12/L — ABNORMAL LOW (ref 4.0–5.2)
WBC Count: 9.5 10*9/L (ref 3.4–10)

## 2018-06-22 LAB — ADDITIONAL INFO: Add'l Info (Send): 1

## 2018-06-22 MED ORDER — INDOMETHACIN 50 MG CAPSULE
50 mg | ORAL | Status: AC
  Administered 2018-06-22: 11:00:00 via ORAL

## 2018-06-22 MED ORDER — SODIUM CHLORIDE 0.9 % INTRAVENOUS PIGGYBACK
0.9 % | INTRAVENOUS | Status: DC
  Administered 2018-06-22 (×2): via INTRAVENOUS

## 2018-06-22 MED ORDER — DIPHENHYDRAMINE 50 MG/ML INJECTION SOLUTION: 50 mg/mL | INTRAMUSCULAR | Status: DC | PRN

## 2018-06-22 MED ORDER — ONDANSETRON HCL (PF) 4 MG/2 ML INJECTION SOLUTION: 4 mg/2 mL | INTRAMUSCULAR | Status: DC | PRN

## 2018-06-22 MED ORDER — PRENATAL VIT,CALCIUM 27-FERROUS FUM 60 MG IRON-FOLIC ACID 1 MG TABLET
60 mg iron-1 mg | ORAL | Status: DC
  Administered 2018-06-22 – 2018-06-24 (×3): via ORAL

## 2018-06-22 MED ORDER — BISACODYL 5 MG TABLET,DELAYED RELEASE: 5 mg | ORAL | Status: DC | PRN

## 2018-06-22 MED ORDER — SODIUM CHLORIDE 0.9 % (FLUSH) INJECTION SYRINGE: 0.9 % | INTRAMUSCULAR | Status: DC | PRN

## 2018-06-22 MED ORDER — INDOMETHACIN 25 MG CAPSULE
25 mg | ORAL | Status: AC
  Administered 2018-06-22 – 2018-06-24 (×6): via ORAL

## 2018-06-22 MED ORDER — CALCIUM CARBONATE 200 MG CALCIUM (500 MG) CHEWABLE TABLET
500 mg (200 mg elemental) | ORAL | Status: DC | PRN
  Administered 2018-06-22: 15:00:00 via ORAL

## 2018-06-22 MED ORDER — DIPHENHYDRAMINE 25 MG CAPSULE
25 mg | ORAL | Status: DC | PRN
  Administered 2018-06-23 – 2018-06-24 (×2): via ORAL

## 2018-06-22 MED ORDER — LIDOCAINE (PF) 10 MG/ML (1 %) INJECTION SOLUTION: 10 mg/mL (1 %) | INTRAMUSCULAR | Status: DC | PRN

## 2018-06-22 MED ORDER — DOCUSATE SODIUM 250 MG CAPSULE
250 mg | ORAL | Status: DC
  Administered 2018-06-22 – 2018-06-24 (×4): via ORAL
  Administered 2018-06-24: 17:00:00 250 mg via ORAL

## 2018-06-22 MED ORDER — BETAMETHASONE ACETATE AND SODIUM PHOS 6 MG/ML SUSPENSION FOR INJECTION
6 mg/mL | INTRAMUSCULAR | Status: AC
  Administered 2018-06-23: 01:00:00 via INTRAMUSCULAR

## 2018-06-22 MED ORDER — SIMETHICONE 80 MG CHEWABLE TABLET: 80 mg | ORAL | Status: DC | PRN

## 2018-06-22 MED ORDER — ACETAMINOPHEN 325 MG TABLET: 325 mg | ORAL | Status: DC | PRN

## 2018-06-22 MED ORDER — SODIUM CHLORIDE 0.9 % (FLUSH) INJECTION SYRINGE
0.9 % | INTRAMUSCULAR | Status: DC
  Administered 2018-06-23 – 2018-06-24 (×3): via INTRAVENOUS

## 2018-06-22 MED ORDER — LACTATED RINGERS INTRAVENOUS SOLUTION
INTRAVENOUS | Status: DC
  Administered 2018-06-22: 11:00:00 via INTRAVENOUS

## 2018-06-22 MED ORDER — MAGNESIUM SULFATE 4 GRAM/50 ML (8 %) IN WATER INTRAVENOUS PIGGYBACK
4 g/50 mL | INTRAVENOUS | Status: DC
  Administered 2018-06-22: 19:00:00 via INTRAVENOUS
  Administered 2018-06-22: 12:00:00 1 g/h via INTRAVENOUS

## 2018-06-22 NOTE — Assessment & Plan Note (Addendum)
12/13: CL 2.2cm with funneling on routine anatomy [place] SVE closed, ruled out for PPROM. 1700 BMZ #1, started on amp and MgSO4. Upon transfer to [facility] SVE 12/14 at 0230 1/2/high.    [ ]  Re-check SVE prn  [ ]   MgSO4, ampicillin if signs of PTL  [x]  12/14 BMZ #2 at 1700  [ ]  Tocolysis with indomethacin through BMZ window (until 12/15 at 1700)  [ ]  F/u GBS from outside hospital  [x]  Consented for c-section for distress, possible classical

## 2018-06-22 NOTE — Progress Notes (Signed)
ANTE INTERVAL ENCOUNTER NOTE    ID: [name] is a 22 y.o. G2P1001 at 98w4dby L=11 with di-di twins, history of [place] depression [place]      COURSE:  Since admission, she no longer reports abdominal pain or contractions. Expressed concern that perhaps that back pain that she was experiencing for many weeks perhaps were contractions and thus causing a change to her cervix/PTL. She reports +FM, no LOF, VB. Has not yet met with Neonatology yet. Pending ultrasound today.     06/22/18 [place]  Twin A: cephalic, Left. EFW: 680 g, DVP 4.9 cm  Twin B: cephalic, Right. EFW: 705g, DVP 8.0 cm  Cervical length: 2.52 cm    PLAN:  #PTL  Given resolution of contractions and abdominal discomfort along with no change to cervix, plan is to discontinue magnesium and antibiotics. Will continue tocolysis with indomethacin through BMZ window.  - Discontinue magnesium and ampicillin  - Continue indomethacin (12/15 1700)  - BMZ #2 today at 1700  - NST qshift      Discussed with DNada Libman MD (MFM Fellow), KKathrine Cords MD (MFM Attending)    YKearney Hard MD  06/22/18

## 2018-06-22 NOTE — Nursing Note (Signed)
RN @ bs to adjust EFM.  Pt unable to rest comfortably in position necessary to maintain continuous monitoring.  RN attempting to trace twins while pt sleeps on her side with minimal luck.  Will continue to readjust.  When tracing, both twin A and twin B are category 1.
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
## 2018-06-22 NOTE — Assessment & Plan Note (Addendum)
EDD 10/07/17 by LMP 01/01/18 = 11w US    Prenatal labs: O pos, antibody neg  03/26/18: Hct 37.9, Plt 260, RPR NR, HIV neg, GC/CT neg/neg, CF neg, A1c 5.0, s/p Varicella vaccine    First trimester screen negative    [ ]  Hepatitis B sAg, rubella IgG, RPR, HIV, T&S ordered on admission

## 2018-06-22 NOTE — Assessment & Plan Note (Addendum)
06/21/18 US: Twin A maternal left, cephalic, placenta posterior, EFW 687g. Twin B maternal right, cephalic, placenta posterior, EFW 693g. Normal anatomy both twins, cervical length 2.2cm, with funneling, nl amniotic fluid volume.     [x]  PDC US in AM- prelim twin A 680g/ Twin B 705g DVP 8cm  [x]  ICN consult
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
## 2018-06-23 LAB — MRSA CULTURE
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
## 2018-06-23 NOTE — Discharge Summary (Addendum)
[facility] - DISCHARGE SUMMARY     Patient Name: Kristin Lee  Patient MRN: [id number]  Date of Birth: 29-Mar-1996    Facility: [facility]  Attending Physician: Morene Rankins, MD  Delivery date: This patient has no babies on file.    Date of Admission: 06/22/2018  Date of Discharge: 06/24/2018    Admission Diagnosis: Preterm Labor  Discharge Diagnosis: Preterm labor    Discharge Disposition: Home    History (with Chief Complaint)    Kristin Lee is a 22 y.o. G2P1001 at [redacted]w[redacted]d by L=11 with di-di twins, history of postpartum depression transferred from [place] for preterm labor.     Patient presented to the hospital in [place] 12/13 for routine anatomy US. She was noted to have normal anatomy of both twins but with cervical funneling and was sent to Labor and Delivery. There she reported contractions since the day prior. She reports increasing strength of contractions. Endorsed possible small amount of fluid leakage the day prior, though this did not continue. Good fetal movement. Denies vaginal bleeding.     At the [facility], SVE closed. Patient received betamethasone at 12/13 1700. She was started on MgSO4 for neuroprotection and penicillin for GBS unknown status. Utox negative, UA negative. GBS was collected. Prior to transfer SVE was 1cm dilated.     Upon transfer to [place], reports contraction pain about 4-5/10. Denies fevers, chills, abdominal pain, nausea, SOB, chest pain.     Antepartum Course    #Preterm Labor  The patient was admitted on 12/13. On her routine anatomy scan, which was delayed due to travel, she had a CL of 2.2cm with funneling noted. She was sent to labor and delivery in [place], where they found her SVE to be closed, and she was ruled out for PPROM. They started her on BMZ, magnesium, and ampicillin and was transferred to [facility]. Upon arrival to [place] her SVE was 1/2/high. She was continued on nifedipine and her contractions resolved. Magnesium and ampicillin were  discontinued. She completed her BMZ course 12/13-14. She received indomethacin through the BMZ window, ending 12/15 at 1700. She was observed overnight off indomethacin and had no contractions. She was therefore thought to be safe for discharge. Patient is planning to continue prenatal care in [facility], but would like to transition care to [facility] or [facility]. Contact information was given for possible providers. She is also still considering where she would like to delivery, deciding between [facility] and  at this time.     #Dichorionic Diamniotic twin gestation  On the patient's anatomy scan on 06/21/18 at the outside hospital, Twin A maternal left, cephalic, placenta posterior, EFW 687g. Twin B maternal right, cephalic, placenta posterior, EFW 693g. Normal anatomy both twins, cervical length 2.2cm, with funneling, nl amniotic fluid volume. On arrival to [facility], she had a repeat ultrasound that showed twin A 680g and Twin B 705g with a DVP 8cm.     #History of postpartum depression  Kristin Lee reported having about 5 months of postpartum depression following her first pregnancy. She endorsed a traumatic birth experience surrounding unsupportive providers and then feeling disconnected from her older child. She never received treatment. During her admission, she saw social work and was found to have no urgent needs.     Vitals  Temp:  [36.7 C-37.1 C] 36.8 C  Heart Rate:  [89-98] 98  *Resp:  [16-17] 16  BP: (94-98)/(46-55) 98/52  SpO2:  [97 %-99 %] 97 %  Physical Exam  General: NAD  Cardiovascular: regular rate  Pulm: nl WOB  Abdomen: gravid  Extremities: no edema, symmetric LE bilaterally     Relevant Labs, Radiology, and Other Studies  Key elements of latest CBC values... Please see Chart Review for additional result details.  Lab Results   Component Value Date    WBC Count 9.5 06/22/2018    Hemoglobin 9.2 (L) 06/22/2018    Hematocrit 28.2 (L) 06/22/2018    MCV 91 06/22/2018    Platelet Count  244 06/22/2018     Prenatal Labs  Lab Results   Component Value Date    ABO/RH(D) O POS 06/22/2018    Antibody Screen NEG 06/22/2018     ID Labs (Last result from the past 8760 hours)    None         Order Current Status    ABO/Rh Confirmation (Blood Bank Released - Requires Separate Phlebotomy) Collected (06/22/18 0330)    HIV Ag/Ab Combo In process    Hepatitis B Surface Antigen In process    Rubella Antibody IgG In process    Treponemal Antibody Only In process            Procedures Performed and Complications  Please see hospital course above.    Discharge Diet  Regular Diet and Breast Feeding Diet    Functional Assessment at Discharge/Activity Goals  None    Allergies and Medications at Discharge    Allergies: Patient has no known allergies.        Pending Tests   I have reviewed the Pending Labs, Microbiology, Pathology and Radiology in the medical record and this patient has no pending tests or abnormal radiology requiring follow up.    Outside Follow-up     Outside Follow Up: for prenatal care    Booked [place] Appointments  No future appointments.    Pending [place] Referrals  None    Case Management Services Arranged  Case Management Services Arranged: (all recorded)           Discharge Assessment  Condition at discharge:  good       Primary Care Physician  Not Confirmed Provider  Address:     Phone: None  Fax: None     Outside Providers, for pending tests please use the following numbers:   For [facility] - Please Call: [phone]    For [place] [facility] - Please Call: [phone]   For [place] [facility] - Please Call: [phone]    Signed,  Franky Macho, MD  06/24/2018    [facility]  [facility]    INPATIENT ATTENDING DISCHARGE ATTESTATION     Attestation  - My date of service is 06/24/18  - I was present for and performed key portions of an examination of the patient.  - I am personally involved in the management of the patient.    I agree with the findings and care  plans as documented.    Discharge Care  - I provided discharge care and performed a physical exam as documented above.  - I discussed the following discharge plan with the patient and/or guardian:  Precautions for twins, preterm labor.   - Discharge care time:  35  minutes.    Additional Attending Services (Beyond Usual Care):  None    I have personally read and interpreted the NST. The indication for testing is preterm contractions. Fetal monitor applied at: 06/24/2018 11:04 AM  Fetal monitor removed at: 06/24/2018 11:50  AM   The baseline is 145 bpm, the variability is Moderate, and the following periodic changes were noted: Accelerations. Overall, the NST is reactive.    Tor NettersMelissa Greer Rosenstein       Signing Provider    Tor NettersMelissa Greer Rosenstein, MD  06/24/2018          Additional Discharge Instructions Provided to the Patient (if any):    Discharge Instructions     Discharge Instructions for Kristin CousinsJacqueline Lee    You were admitted to [place] to make sure your were not going into preterm labor. We feel comfortable discharging you at this time, and do not think that you are in labor. While you were here, you received two doses of a medication called betamethasone, which is a steroid to help with fetal lung development.     We discussed your plans for prenatal care and location of delivery.   You indicated that you did not want to [facility] due to your high risk pregnancy and poor experience in your first delivery.  Please consider reaching our to Dr. Dalbert MayotteAmira Quevedo at [facility] as an alternative provider. Her number is [phone].       Preterm Labor: Care Instructions  Your Care Instructions    Preterm labor is the start of labor between 20 and 36 weeks of pregnancy. Most babies are born at 6737 to 42 weeks of pregnancy. In labor, the uterus contracts to open the cervix. This is the first stage of childbirth. Preterm labor can be caused by a problem with the baby, the  mother, or both. Often the cause is not known.  In some cases, doctors use medicines to try to delay labor until 34 or more weeks of pregnancy. By this time, a baby has grown enough so that problems are not likely. In some casessuch as with a serious infectionit is healthier for the baby to be born early. Your treatment will depend on how far along you are in your pregnancy and on your health and your baby's health.  Follow-up care is a key part of your treatment and safety. Be sure to make and go to all appointments, and call your doctor if you are having problems. It's also a good idea to know your test results and keep a list of the medicines you take.  How can you care for yourself at home?   If your doctor prescribed medicines, take them exactly as directed. Call your doctor if you think you are having a problem with your medicine.   Rest until your doctor advises you about activity. He or she will tell you if you should stay in bed most of the time. You may need to arrange for child care if you have young children.   Do not have sexual intercourse unless your doctor says it is safe.   Use pads, not tampons, if you have vaginal bleeding.   Make sure to drink plenty of fluids. Dehydration can lead to contractions. If you have kidney, heart, or liver disease and have to limit fluids, talk with your doctor before you increase the amount of fluids you drink.   Do not smoke or allow others to smoke around you. If you need help quitting, talk to your doctor about stop-smoking programs and medicines. These can increase your chances of quitting for good.  When should you call for help?  Call 911 anytime you think you may need emergency care. For example, call if:  You passed out (lost consciousness).     You have a seizure.     You have severe vaginal bleeding.     You have severe pain in your belly or pelvis.     You have had fluid gushing or leaking from your vagina and you know or think the  umbilical cord is bulging into your vagina. If this happens, immediately get down on your knees so your rear end (buttocks) is higher than your head. This will decrease the pressure on the cord until help arrives.   Call your doctor now or seek immediate medical care if:    You have signs of preeclampsia, such as:  ? Sudden swelling of your face, hands, or feet.  ? New vision problems (such as dimness, blurring, or seeing spots).  ? A severe headache.     You have any vaginal bleeding.     You have belly pain or cramping.     You have a fever.     You have had regular contractions (with or without pain) for an hour. This means that you have 6 or more within 1 hour after you change your position and drink fluids.     You have a sudden release of fluid from the vagina.     You have low back pain or pelvic pressure that does not go away.     You notice that your baby has stopped moving or is moving much less than normal.   Watch closely for changes in your health, and be sure to contact your doctor if you have any problems.  Where can you learn more?  Go to [email or web address].  Enter Q400 in the search box to learn more about "Preterm Labor: Care Instructions."  Current as of: Dec 05, 2017                 Patient Instructions    None

## 2018-06-23 NOTE — Progress Notes (Addendum)
OBSTETRICS LABOR ANTEPARTUM PROGRESS NOTE     Completed by: Resident with Attending Attestation    Events  admitted for PTL  Contractions resolved and Mg and ampicillin discontinued  BMZ# 2 @1700   Indomethacin continued through BMZ window  PDC [place]- Twin A 680g, Twin B 705g DVP 8, cervix 2-2.5cm long  ICN consult    Subjective  Feeling well, no further contractions.     Vitals  Temp:  [36.6 C-36.7 C] 36.6 C  Heart Rate:  [74-96] 96  *Resp:  [16-20] 18  BP: (84-108)/(45-62) 106/53  SpO2:  [93 %-100 %] 96 %    Intake/Output Summary (Last 24 hours) at 06/22/2018 2315  Last data filed at 06/22/2018 7829  Gross per 24 hour   Intake 990.63 ml   Output 2050 ml   Net -1059.37 ml       Physical Exam  General: NAD  Cardiovascular: regular rate  Pulm: nl WOB  Abdomen: gravid, nontender  Extremities: no edema, symmetric LE bilaterally    Cervix: 06/21/18  *Dilation: 1  *Effacement (cm): 2  *Station: -3      NST Interpretation: reactive     Fetal Heart Rate Tracing A:  Baseline rate:  150  Variability:  moderate  FHR Changes:  accels, no decels    Fetal Heart Rate Tracing B:  Baseline rate:  150  Variability:  moderate  FHR Changes:  accels, no decels    Data  Labs Reviewed and Significant for:  CBC        06/22/18  0200   WBC 9.5   HGB 9.2*   HCT 28.2*   PLT 244       Assessment/Plan:  Pt is a 22 y.o. G2P1001 at [redacted]w[redacted]d admitted with by L=11 with di-di twins, history of postpartum depression transferred from [place] for preterm labor. Stable exam over several hours with resolution of contractions s/p BMZ #2 at 1700. Currently off Mg and ampicillin. Will continue surveillance through tomorrow, possible d/c in AM.    History of postpartum depression  Assessment & Plan  Kristin Lee reports having about 5 months of postpartum depression following her first pregnancy. She endorses a traumatic birth experience surrounding unsupportive providers and then feeling disconnected from her older child. She never received treatment.     [ ]  SW  consult in AM    Pregnancy with prenatal care elsewhere  Assessment & Plan  EDD 10/07/17 by LMP 01/01/18 = 11w [place]    Prenatal labs: O pos, antibody neg  03/26/18: Hct 37.9, Plt 260, RPR NR, HIV neg, GC/CT neg/neg, CF neg, A1c 5.0, s/p Varicella vaccine    First trimester screen negative    [ ]  Hepatitis B sAg, rubella IgG, RPR, HIV, T&S ordered on admission    Preterm labor  Assessment & Plan  12/13: CL 2.2cm with funneling on routine anatomy US -> L&D in [place]. SVE closed, ruled out for PPROM. 1700 BMZ #1, started on amp and MgSO4. Upon transfer to [place] SVE 12/14 at 0230 1/2/high.    [ ]  Re-check SVE prn  [ ]   MgSO4, ampicillin if signs of PTL  [x]  12/14 BMZ #2 at 1700  [ ]  Tocolysis with indomethacin through BMZ window (until 12/15 at 1700)  [ ]  F/u GBS from outside hospital  [x]  Consented for c-section for distress, possible classical    Dichorionic diamniotic twin gestation  Assessment & Plan  06/21/18 [place]: Twin A maternal left, cephalic, placenta posterior, EFW 687g. Twin B maternal  right, cephalic, placenta posterior, EFW 693g. Normal anatomy both twins, cervical length 2.2cm, with funneling, nl amniotic fluid volume.     [x]  PDC US in AM- prelim twin A 680g/ Twin B 705g DVP 8cm  [x]  ICN consult      Fetal well being: reassuring     Mode of delivery: cephalic x2, C/s for distress, possible classical    Kristin GriffonBlair Kambre Messner MD PGY-2  OBGYN & RS   06/22/18

## 2018-06-23 NOTE — Consults (Signed)
Neonatology Consult Note    Neonatology has been asked by OB to speak with this 22y/o G2/P1001 woman now at 4924w5d GA di-di twins who has presented with concern for preterm labor. Her membranes are intact with normal fluid volume, and she is currently receiving BMZ, tocolysis, and magnesium for neuroprotection. EFWs A= 680g, B= 705g.     I spoke at length with this patient about delivery at 10024 weeks gestational age, including morbidities and mortality. The overall prognosis guarded.    Typical in hospital complications for a 24 week infant include, but are not limited to:  - intraventricular hemorrhage with resulting hydrocephalus,  - White matter injury  - respiratory distress and mechanical ventilation  - necrotizing enterocolitis necessitating surgical intervention  - patent ductus arteriosus necessitating surgical ligation  - sepsis    Possible long term complication for a 24 week infant include, but are not limited to:  - cerebral palsy, possibly non-ambulatory cerebral palsy  - cognitive impairment, sensory processing disorder, autisum  - feeding difficulties possibly requiring Gastrostomy tube placement  - chronic lung disease requiring oxygen supplementation at discharge or in severe cases trachesostomy tube placement.    I further explained that the risks of these in hospital and long term complications decrease with gestational age.    The resuscitation of a 24 week infant, the procedures that would be required at delivery, and the long duration of hospitalization were all described.    After discussing the risks for her child I presented the option to aggressively resuscitate her infant if she should progress to delivery prior to [redacted] weeks gestation and to monitor and c-section for distress, or to elect not to c/s for distress in which case we would not monitor before 25 weeks, knowing that the baby could die in utero if it becomes distressed. I did tell her that, should they choose aggressive resuscitation  and the baby were to develop severe intraventricular hemorrhage or other evidence of devastating brain injury early in its hospital course, there would be an opportunity to re-direct to comfort care, and their options would be re-addressed at that time.    I also presented the option of comfort care which would include providing only comfort measures, warmth, skin to skin, morphine if needed to the family.    At this time the patient has decided to go forward with full neonatal resuscitation. She did mention, however, that if the babies are not doing well despite our efforts that she would consider redirection of care.     The patient is, of course, hopeful that there will be no indication for delivery at this time. She asked many appropriate questions, and all her questions were answered. I encouraged her to call the ICN with any further questions and would be happy to see her again with further updates.    Kristin ShipImran Mellie Buccellato, MD PhD  Neonatology Fellow  06/22/18    I counseled the patient about the importance of pumping as soon as possible after delivery and the benefits of oral colostrum for preterm infants: yes

## 2018-06-24 LAB — HEPATITIS B SURFACE ANTIGEN: Hep B surf Ag: NEGATIVE

## 2018-06-24 LAB — HIV ANTIBODY AND ANTIGEN COMBI: HIV Ag/Ab Combo: NEGATIVE

## 2018-06-24 LAB — RUBELLA ANTIBODY IGG: Rubella Antibody: 2.38 Index — ABNORMAL HIGH (ref <0.90)

## 2018-06-24 LAB — TREPONEMAL ANTIBODY ONLY: Treponemal Antibody Only: NONREACTIVE

## 2018-06-24 NOTE — Progress Notes (Signed)
OBSTETRICS LABOR ANTEPARTUM PROGRESS NOTE     Completed by: Resident with Attending Attestation    Events  No acute overnight events     Subjective  Feeling well this morning, no further contractions. She had some difficulty sleeping overnight due to fetal movement and light in the room. Feels good about discharge today.     Vitals  Temp:  [36.7 C-37.1 C] 36.7 C  Heart Rate:  [83-95] 89  *Resp:  [16-17] 16  BP: (94-98)/(46-55) 94/46  SpO2:  [99 %] 99 %    Intake/Output Summary (Last 24 hours) at 06/24/2018 0911  Last data filed at 06/23/2018 2056  Gross per 24 hour   Intake 3 ml   Output    Net 3 ml       Physical Exam  General: NAD  Cardiovascular: regular rate  Pulm: nl WOB  Abdomen: gravid  Extremities: no edema, symmetric LE bilaterally    Cervix: 06/21/18  *Dilation: 1  *Effacement (cm): 2  *Station: -3      NST Interpretation: reactive     Fetal Heart Rate Tracing A:  Baseline rate:  150  Variability:  moderate  FHR Changes:  accels, no decels    Fetal Heart Rate Tracing B:  Baseline rate:  150  Variability:  moderate  FHR Changes:  accels, no decels    Data  Labs Reviewed and Significant for:  CBC  No results found in last 36 hours    Assessment/Plan:  Pt is a 22 y.o. G2P1001 at 7441w6d admitted with by L=11 with di-di twins, history of postpartum depression [place]. Stable exam over several hours with resolution of contractions s/p BMZ #2 at 1700. Currently off Mg and ampicillin.     History of postpartum depression  Assessment & Plan  Ms. Ashley JacobsReyes reports having about 5 months of postpartum depression following her first pregnancy. She endorses a traumatic birth experience surrounding unsupportive providers and then feeling disconnected from her older child. She never received treatment.     [ ]  SW consult in AM    Pregnancy with prenatal care elsewhere  Assessment & Plan  EDD 10/07/17 by LMP 01/01/18 = 11w [place]    Prenatal labs: O pos, antibody neg  03/26/18: Hct 37.9, Plt  260, RPR NR, HIV neg, GC/CT neg/neg, CF neg, A1c 5.0, s/p Varicella vaccine    First trimester screen negative    [ ]  Hepatitis B sAg, rubella IgG, RPR, HIV, T&S ordered on admission    Dichorionic diamniotic twin gestation  Assessment & Plan  06/21/18 US: Twin A maternal left, cephalic, placenta posterior, EFW 687g. Twin B maternal right, cephalic, placenta posterior, EFW 693g. Normal anatomy both twins, cervical length 2.2cm, with funneling, nl amniotic fluid volume.     [x]  PDC US in AM- prelim twin A 680g/ Twin B 705g DVP 8cm  [x]  ICN consult    * Preterm labor  Assessment & Plan  12/13: CL 2.2cm with funneling on routine anatomy US -> [place] in [place]. SVE closed, ruled out for PPROM. 1700 BMZ #1, started on amp and MgSO4. Upon transfer to [place] SVE 12/14 at 0230 1/2/high.    [ ]  Re-check SVE prn  [ ]   MgSO4, ampicillin if signs of PTL  [x]  12/14 BMZ #2 at 1700  [ ]  Tocolysis with indomethacin through BMZ window (until 12/15 at 1700)  [ ]  F/u GBS from outside hospital  [x]  Consented for c-section for distress, possible classical  Plan for discharge today.     Fetal well being: reassuring     Mode of delivery: cephalic x2, C/s for distress, possible classical    Seen with Dr. Corena Herter and Dr. Valinda Hoar, MD/R2  06/24/18

## 2018-06-24 NOTE — Discharge Instructions (Addendum)
Discharge Instructions for Kristin CousinsJacqueline Lee    You were admitted to [facility] to make sure your were not going into preterm labor. We feel comfortable discharging you at this time, and do not think that you are in labor. While you were here, you received two doses of a medication called betamethasone, which is a steroid to help with fetal lung development.     We discussed your plans for prenatal care and location of delivery. Please consider reaching our to Dr. Dalbert MayotteAmira Quevedo at [facility]. Her number is [phone].       Preterm Labor: Care Instructions  Your Care Instructions    Preterm labor is the start of labor between 20 and 36 weeks of pregnancy. Most babies are born at 7437 to 42 weeks of pregnancy. In labor, the uterus contracts to open the cervix. This is the first stage of childbirth. Preterm labor can be caused by a problem with the baby, the mother, or both. Often the cause is not known.  In some cases, doctors use medicines to try to delay labor until 34 or more weeks of pregnancy. By this time, a baby has grown enough so that problems are not likely. In some casessuch as with a serious infectionit is healthier for the baby to be born early. Your treatment will depend on how far along you are in your pregnancy and on your health and your baby's health.  Follow-up care is a key part of your treatment and safety. Be sure to make and go to all appointments, and call your doctor if you are having problems. It's also a good idea to know your test results and keep a list of the medicines you take.  How can you care for yourself at home?   If your doctor prescribed medicines, take them exactly as directed. Call your doctor if you think you are having a problem with your medicine.   Rest until your doctor advises you about activity. He or she will tell you if you should stay in bed most of the time. You may need to arrange for child care if you have young children.   Do not have sexual intercourse  unless your doctor says it is safe.   Use pads, not tampons, if you have vaginal bleeding.   Make sure to drink plenty of fluids. Dehydration can lead to contractions. If you have kidney, heart, or liver disease and have to limit fluids, talk with your doctor before you increase the amount of fluids you drink.   Do not smoke or allow others to smoke around you. If you need help quitting, talk to your doctor about stop-smoking programs and medicines. These can increase your chances of quitting for good.  When should you call for help?  Call 911 anytime you think you may need emergency care. For example, call if:    You passed out (lost consciousness).     You have a seizure.     You have severe vaginal bleeding.     You have severe pain in your belly or pelvis.     You have had fluid gushing or leaking from your vagina and you know or think the umbilical cord is bulging into your vagina. If this happens, immediately get down on your knees so your rear end (buttocks) is higher than your head. This will decrease the pressure on the cord until help arrives.   Call your doctor now or seek immediate medical care if:  You have signs of preeclampsia, such as:  ? Sudden swelling of your face, hands, or feet.  ? New vision problems (such as dimness, blurring, or seeing spots).  ? A severe headache.     You have any vaginal bleeding.     You have belly pain or cramping.     You have a fever.     You have had regular contractions (with or without pain) for an hour. This means that you have 6 or more within 1 hour after you change your position and drink fluids.     You have a sudden release of fluid from the vagina.     You have low back pain or pelvic pressure that does not go away.     You notice that your baby has stopped moving or is moving much less than normal.   Watch closely for changes in your health, and be sure to contact your doctor if you have any problems.  Where can you learn  more?  Go to [email or web address].  Enter Q400 in the search box to learn more about "Preterm Labor: Care Instructions."  Current as of: Dec 05, 2017

## 2018-06-25 NOTE — Procedures (Signed)
[facility]  Department of OB/GYN & Reproductive Sciences  Prenatal Diagnostic Center   [address] Third Floor  [place]  [id number]  [phone], [phone]  June 25, 2018    [facility]          Ultrasound Report     Re: Kristin CousinsJacqueline Lee (DOB: October 19, 1995)  MRN: [facility] MRN:  [id number]     INDICATION: preterm labor  TECHNIQUE: transabdominal and endovaginal    Dear  Provider:    Ms. Kristin Lee, who is a 22 year old female, grav 2, para 1, was seen in our office  on June 22, 2018 for ultrasound  due to: preterm labor.     A dichorionic/diamniotic living twin gestation is identified.  Twin A is  left/inferior, vertex, and the placenta is posterior. The placental cord  insertion is normal.  Twin B is right/superior, vertex, and the placenta is  posterior.  The placental cord insertion is normal.  The amniotic fluid volume  for both twins is normal.  The DVP for twin A is 6.1 cm.  The DVP for twin B is  8.1 cm.   The uterus was unremarkable. The placenta has no evidence of previa.  Both ovaries were visualized and were normal in appearance.  The cervix  appeared shortened with some funneling  endovaginally, measuring 2.1-2.2 cm.    The following measurements were obtained:    Twin A:    BPD    60 mm,    24 weeks    4 days                                                                                                                                                                                                                                                    HC    222 mm,    24 weeks    2 days      AC    195 mm,    24 weeks    1 days      FL    44 mm,    24 weeks    3 days    Gestational age based on biometry is  24 weeks 2 days with an EDD  of 10/10/2018.   The EFW is 680 grams.  This is in the 28.4 %ile.       Twin B:    BPD    63 mm,    25 weeks    4 days      HC    226 mm,    24 weeks    5 days      AC    198 mm,    24 weeks    3 days      FL    44 mm,    24 weeks     4 days    Gestational age based on biometry is  24 weeks 5 days with an EDD  of  10/07/2018. The EFW of fetus B is 705 grams.  This is in the 37.5 %ile.      Gestational age based on patient's reported EDC of 10/08/2018 is 24 weeks 4  days.        This examination included a  survey of the fetal anatomy. The survey is  comprised of (but not limited to) the following views where technically  possible and clinically appropriate:   Brain  parenchyma, cerebral ventricles (including 4th ventricle), cerebellum,  choroid plexus, cisterna magna, midline falx, cavum septi pellucidi, and nuchal  thickness  Spine - cervical, thoracic, lumbar and sacral spine, shape and curvature  Face - orbits including lens, fetal upper lip, maxilla, mandible, nose, nasal  bone, profile, and neck  Heart  situs, four-chamber image of the heart, great vessels (and/or outflow  tracts), 3 vessel view, superior and inferior vena cava and aortic arch.  Abdomen - diaphragm, kidneys, gallbladder, liver, stomach (left-sided),  kidneys, urinary bladder, ventral abdominal wall, umbilical cord insertion into  the fetal abdomen, and umbilical cord vessel number.  Extremities - legs, arms, hands and feet.      In twin A no major structural abnormalities were identified.  In twin B, these  images were significant for mild polyhydramnios.      Impressions:  This is a dichorionic, diamniotic twin gestation.  Both fetuses  are appropriately grown for gestational age, with normal amniotic fluid  pockets.  There is a 4 percent size discordance between the fetuses.  Recommend  follow up for growth in four weeks.  A targeted cervical length measurement was  performed and the cervix measures 2.1-2.2 of closed length.          Sincerely yours,      Autumn Messing, M.D.  Perinatal Medicine & Genetics      Electronic Signature: Autumn Messing, M.D.  Signed 06/25/2018 at 2:42 PM

## 2018-07-05 ENCOUNTER — Inpatient Hospital Stay
Admission: TF | Admit: 2018-07-05 | Discharge: 2018-07-06 | Disposition: A | Discharge status: Home / Self Care | Payer: PRIVATE HEALTH INSURANCE | Source: Intra-hospital | Attending: Maternal & Fetal Medicine | Admitting: Maternal & Fetal Medicine

## 2018-07-05 DIAGNOSIS — Z3A26 26 weeks gestation of pregnancy: Secondary | ICD-10-CM

## 2018-07-05 DIAGNOSIS — O30042 Twin pregnancy, dichorionic/diamniotic, second trimester: Secondary | ICD-10-CM

## 2018-07-05 DIAGNOSIS — O479 False labor, unspecified: Secondary | ICD-10-CM

## 2018-07-05 DIAGNOSIS — O30049 Twin pregnancy, dichorionic/diamniotic, unspecified trimester: Secondary | ICD-10-CM

## 2018-07-05 DIAGNOSIS — O321XX1 Maternal care for breech presentation, fetus 1: Secondary | ICD-10-CM

## 2018-07-05 DIAGNOSIS — Z8659 Personal history of other mental and behavioral disorders: Secondary | ICD-10-CM

## 2018-07-05 DIAGNOSIS — O322XX2 Maternal care for transverse and oblique lie, fetus 2: Secondary | ICD-10-CM

## 2018-07-05 LAB — COMPLETE BLOOD COUNT
Hematocrit: 26.9 % — ABNORMAL LOW (ref 36–46)
Hemoglobin: 8.9 g/dL — ABNORMAL LOW (ref 12.0–15.5)
MCH: 29 pg (ref 26–34)
MCHC: 33.1 g/dL (ref 31–36)
MCV: 88 fL (ref 80–100)
Platelet Count: 249 10*9/L (ref 140–450)
RBC Count: 3.07 10*12/L — ABNORMAL LOW (ref 4.0–5.2)
WBC Count: 7.7 10*9/L (ref 3.4–10)

## 2018-07-05 LAB — TYPE AND SCREEN
ABO/RH(D): O POS
Antibody Screen: NEGATIVE

## 2018-07-05 LAB — TREPONEMAL ANTIBODY ONLY: Treponemal Antibody Only: NONREACTIVE

## 2018-07-05 MED ORDER — DIPHENHYDRAMINE 25 MG CAPSULE: 25 mg | ORAL | Status: DC | PRN

## 2018-07-05 MED ORDER — LIDOCAINE (PF) 10 MG/ML (1 %) INJECTION SOLUTION: 10 mg/mL (1 %) | INTRAMUSCULAR | Status: DC | PRN

## 2018-07-05 MED ORDER — INDOMETHACIN 50 MG CAPSULE
50 mg | ORAL | Status: AC
  Administered 2018-07-05: 18:00:00 via ORAL

## 2018-07-05 MED ORDER — DIPHENHYDRAMINE 50 MG/ML INJECTION SOLUTION: 50 mg/mL | INTRAMUSCULAR | Status: DC | PRN

## 2018-07-05 MED ORDER — PRENATAL VIT,CALCIUM 27-FERROUS FUM 60 MG IRON-FOLIC ACID 1 MG TABLET
60 mg iron-1 mg | ORAL | Status: DC
  Administered 2018-07-05: 17:00:00 1 via ORAL
  Administered 2018-07-06: 17:00:00 via ORAL

## 2018-07-05 MED ORDER — INDOMETHACIN 25 MG CAPSULE
25 mg | ORAL | Status: DC
  Administered 2018-07-05 – 2018-07-06 (×4): via ORAL

## 2018-07-05 MED ORDER — ONDANSETRON HCL (PF) 4 MG/2 ML INJECTION SOLUTION: 4 mg/2 mL | INTRAMUSCULAR | Status: DC | PRN

## 2018-07-05 MED ORDER — SIMETHICONE 80 MG CHEWABLE TABLET: 80 mg | ORAL | Status: DC | PRN

## 2018-07-05 MED ORDER — SODIUM CHLORIDE 0.9 % (FLUSH) INJECTION SYRINGE
0.9 % | INTRAMUSCULAR | Status: DC
  Administered 2018-07-05 – 2018-07-06 (×3): via INTRAVENOUS

## 2018-07-05 MED ORDER — BISACODYL 5 MG TABLET,DELAYED RELEASE: 5 mg | ORAL | Status: DC | PRN

## 2018-07-05 MED ORDER — CALCIUM CARBONATE 200 MG CALCIUM (500 MG) CHEWABLE TABLET: 500 mg (200 mg elemental) | ORAL | Status: DC | PRN

## 2018-07-05 MED ORDER — SODIUM CHLORIDE 0.9 % (FLUSH) INJECTION SYRINGE: 0.9 % | INTRAMUSCULAR | Status: DC | PRN

## 2018-07-05 MED ORDER — DOCUSATE SODIUM 250 MG CAPSULE
250 mg | ORAL | Status: DC
  Administered 2018-07-05 – 2018-07-06 (×3): via ORAL

## 2018-07-05 MED ORDER — ACETAMINOPHEN 325 MG TABLET: 325 mg | ORAL | Status: DC | PRN

## 2018-07-05 MED ORDER — BETAMETHASONE ACETATE AND SODIUM PHOS 6 MG/ML SUSPENSION FOR INJECTION
6 mg/mL | INTRAMUSCULAR | Status: AC
  Administered 2018-07-06: 11:00:00 via INTRAMUSCULAR

## 2018-07-05 NOTE — Anesthesia Pre-Procedure Evaluation (Signed)
ANESTHESIA PRE-OP H&P      Anesthesia Encounter History    I have assessed Shared Data in APeX as listed in the Pre-Op Extract report.    CC/HPI/Past Medical History Summary: 22 yo G2P1 at 26+3 with di-di twins admitted to antepartum for c/f preterm labor    (Please refer to APeX Allergies, Problems, Past Medical History, Past Surgical History, Social History, and Family History activities, Results for current data from these respective sections of the chart; these sections of the chart are also summarized in reports, including the Patient Summary Extracts found in Chart Review)              Review of Systems     Airway: Negative.    Respiratory: Negative.    Cardiovascular: Negative.    Gastrointestinal: Positive for GERD Symptoms. Negative for nausea.       Physical Exam    Airway: Modified Mallampati score: II. Thyromental distance: 4 cm - 6.5 cm. Mouth opening: good. Neck range of motion: full.   Constitutional:       Appearance: She is well-developed and well-nourished. obese  HENT:      Head: Normocephalic and atraumatic.   Eyes:      Conjunctiva/sclera: Conjunctivae normal.   Cardiovascular:      Rate and Rhythm: Normal rate.   Pulmonary:      Effort: Pulmonary effort is normal.   Skin:     General: Skin is warm and dry.   Neurological:      Mental Status: She is alert.   Psychiatric:         Mood and Affect: Mood and affect normal.     Dental: no poor dentition         Prepare (Pre-Operative Clinic) Assessment/Plan/Narrative    Stop Brennan BaileyBang information    Anesthesia Assessment and Plan  Anesthesia Plan  Invasive Monitors/Vascular Access: None    (See Anesthesia Record for attending attestation)    [Please note, smart link data included in this note may not reflect changes since note creation. Please see appropriate section of APeX for up-to-the minute information.]

## 2018-07-05 NOTE — Assessment & Plan Note (Addendum)
12/27 OSH: US twin A frank breech 823g DVP 6cm; twin B transverse spine down 867g DVP 7cm

## 2018-07-05 NOTE — Assessment & Plan Note (Addendum)
No active symptoms or medications    [x]  [facility]

## 2018-07-05 NOTE — H&P (Addendum)
OBSTETRICS H&P NOTE     Completed by Resident with Attending Attestation.    History of Present Illness  Kristin CousinsJacqueline Platas is a 22 y.o. G2P1001 at 7732w3d with di-di twins transferred [place] [facility] [place].     Patient was previously admitted to [facility] from 12/14-12-16 for preterm contractions. During that admission, SVE was 1/2/high and remained unchanged for the duration of her stay. She received BMZ 12/13-12/14 and was discharged on 12/16.    Patient reports that she presented with [facility] on 12/26 with increasing contractions. Evaluation at OSH showed:    T36.0, BP105/58, P95, RR18   FHT reactive x2  Toco ctx q 3-5 min  Per Dr. Ashok CroonYeh, ruled out for PPROM   SVE 2/30% effaced/-3 at 12 midnight (change from 1/2/high during last admission)  FFN today negative  GBS reportedly negative from OSH on 12/13    US today twin A frank breech 823g DVP 6cm; twin B transverse spine down 867g DVP 7cm    She received a dose of rescue betamethasone at 0230 on 12/27. Repeat SVE at outside hospital was unchanged. She was started on tocolysis with indomethacin, as well as magnesium for fetal neuroprotection.     On arrival to [facility], patient reports that she is feeling overall well. She denies ongoing contractions. She did notice pink tinged discharge prior to presentation to OSH, but denies frank vaginal bleeding. She reports good fetal movement.     Patient Active Problem List    Diagnosis Date Noted    Preterm contractions 07/05/2018    Dichorionic diamniotic twin gestation 06/22/2018    Pregnancy with prenatal care elsewhere 06/22/2018    History of postpartum depression 06/22/2018        Prenatal Labs  Lab Results   Component Value Date    ABO/RH(D) O POS 07/05/2018    Antibody Screen NEG 07/05/2018    Rubella Antibody 2.38 (H) 06/22/2018    Hepatitis B Surface Antigen NEG 06/22/2018    HIV Ag/Ab Combo NEG 06/22/2018       Dating  FINAL EDD: Estimated Date of Delivery: 10/08/18 by L=11.    Prenatal Care  [facility]    No past  medical history on file.  Past Surgical History:   Procedure Laterality Date    KNEE SURGERY         OB History   Gravida Para Term Preterm AB Living   2 1 1     1    SAB TAB Ectopic Molar Multiple Live Births             1      # Outcome Date GA Lbr Len/2nd Weight Sex Delivery Anes PTL Lv   2 Current            1 Term    3.402 kg (7 lb 8 oz)     LIV       There is no immunization history on file for this patient.    Allergies: Patient has no known allergies.     No medications prior to admission.     Review of Systems  Per HPI    Vitals  Temp:  [36.8 C] 36.8 C  Heart Rate:  [93-94] 94  *Resp:  [18] 18  BP: (93)/(58) 93/58  SpO2:  [98 %] 98 %  Height: 162.6 cm  Weight: 65.3 kg    Physical Examination  Gen: NAD  CV: regular rate  Pulm: breathing comfortably on room air  Abd: soft, gravid  Ext: symmetric, nontender    Cervix:  Deferred    Membrane Status:  Intact    Fetus Presentation: breech/transverse    Fetal Heart Rate Tracing:  Baseline rate: 130 bpm  Variability: Moderate  FHR Changes: Accelerations    Tocometer:   Uterine Activity  $ Mode: Toco;Palpation  Contraction Frequency: 2x/ 30 min  Contraction Duration: 40-50  Contraction Quality: Mild  Resting Tone Palpated: Soft     NST reactive x2    Data  CBC        07/05/18  0900   WBC 7.7   HGB 8.9*   HCT 26.9*   PLT 249       Problem-based Assessment & Plan    Kristin CousinsJacqueline Kisiel is a 22 y.o. G2P1001 at 686w3d with di-di twins transferred from [place] for rule out preterm labor.     Preterm contractions  Assessment & Plan  Comfortable on admission with no contracts. SVE at OSH 2/30% effaced/-3, unchanged on repeat. BMZ#1 given 0230 12/27. Mag had been started but discontinued during transport. Indomethacin started for tocolysis at OSH    [ ]  BMZ#2 @ 0230 12/28  [ ]  Continue indomethacin for tocolysis  [ ]  Repeat SVE prn  [ ]  NST BID    History of postpartum depression  Assessment & Plan  No active symptoms or medications    [ ]  SW prn     Dichorionic diamniotic  twin gestation  Assessment & Plan  12/27: US twin A frank breech 823g DVP 6cm; twin B transverse spine down 867g DVP 7cm      Moody BruinsIman Khadija Marlena Barbato, MD  07/05/2018    [facility]  [facility]    INPATIENT ATTENDING ATTESTATION     Attestation  - My date of service is 07/05/2018  - I was present for and performed key portions of an examination of the patient.  - I am personally involved in the management of the patient.    I agree with the findings and care plans as documented.   Kristin CousinsJacqueline Leatherwood is a 22 y.o. G2P1001 at 856w3d with di/di twins, admitted for preterm labor after transport from OSH. Clinically improved. Occasional contraction on monitor. Mag for transport, turned off. Received rescue BMZ #1 at OSH, will complete course here. Last growth 12/27 and S=D x 2. Anticipate discharge tomorrow.     Additional Attending Services (Beyond Usual Care):  None    Signing Provider    Cassandria AngerAmanda Yeaton-Massey, MD  07/05/2018    I have personally read and interpreted the NST. The indication for testing is twins, preterm labor. Fetal monitor applied at: 07/05/2018  8:00 AM  Fetal monitor removed at: 07/05/2018  9:55 PM    AAA   The baseline is 145 bpm, the variability is Moderate, and the following periodic changes were noted: Accelerations. Overall, the NST is reactive.    BBB   The baseline is 140 bpm, the variability is Moderate, and the following periodic changes were noted: Accelerations. Overall, the NST is reactive.    Cassandria AngerAmanda Yeaton-Massey, MD

## 2018-07-05 NOTE — Telephone Encounter (Signed)
MFM Transport Request  Dr. Santa [facility]ighterAndrew Yeh, [facility] [place]    22 y.o. G2P1001 at 6641w3d  Di/Di twins  [facility] (BMZ 12/13-12/14)  Presents today with increasing contractions    T36.0, BP105/58, P95, RR18   FHT reactive x2  Toco ctx q 3-5 min  Per Dr. Ashok CroonYeh, ruled out for PPROM   SVE 2/30% effaced/-3 at 12 midnight (change from 1/2/high during last admission)  FFN today negative  GBS reportedly negative from OSH on 12/13    US today twin A frank breech 823g DVP 6cm; twin B transverse spine down 867g DVP 7cm    Received nifedipine 10 mg PO x1 at 12 midnight  Received 1 liter LR bolus, now running at 150 cc/hr     Initiate rescue BMZ course  Indomethacin 50 mg loading dose followed by 25 mg q6 hr for tocolysis (same dosing as last admission)  Magnesium 4g load then 1g/hr for neuroprotection  Recheck SVE prior to transport

## 2018-07-05 NOTE — Assessment & Plan Note (Addendum)
Patient admitted on 12/27 at a transfer from OSH. SVE at OSH 2/30% effaced/-3, unchanged on repeat exam on admission. BMZ course given 12/27-12/28. Mag had been started at OSH but discontinued during transport. Indomethacin started for tocolysis at OSH and also discontinued after [facility]. After monitoring for 24 hours, patient contractions subsided. SVE on 07/06/2018 was unchanged at 2/2.5/-3. NST was reactive throughout [facility].     Plan:  Discharge 07/06/2018 with plan for follow up with local providers Dr. Santa LighterAndrew Yeh or [facility]. On day of discharge had long discussion with patient about return precautions. Also discussed goal to get to 37-38 weeks for delivery.     [ ]  GBS collected today 07/06/2018, will f/u results

## 2018-07-05 NOTE — Discharge Summary (Signed)
[facility] - DISCHARGE SUMMARY     Patient Name: Kristin CousinsJacqueline Descoteaux  Patient MRN: [id number]  Date of Birth: 13-Oct-1995    [facility]ility[facility]  Attending Physician: No att. providers found    Date of Admission: 07/05/2018  Date of Discharge: 07/06/2018    Admission Diagnosis: Preterm contractions  Discharge Diagnosis: Preterm contractions    Discharge Disposition: Home    History (with Chief Complaint)    Kristin Lee is a 22 y.o. G2P1001 at 4284w3d with di-di twins transferred from [place] for preterm labor.     Patient was previously admitted to [facility] from 12/14-12-16 for preterm contractions. During that admission, SVE was 1/2/high and remained unchanged for the duration of her stay. She received BMZ 12/13-12/14 and was discharged on 12/16.    Patient reports that she presented with [place] on 12/26 with increasing contractions. Evaluation at OSH showed:    T36.0, BP105/58, P95, RR18   FHT reactive x2  Toco ctx q 3-5 min  Per Dr. Ashok CroonYeh, ruled out for PPROM   SVE 2/30% effaced/-3 at 12 midnight (change from 1/2/high during last admission)  FFN today negative  GBS reportedly negative from OSH on 12/13    US today twin A frank breech 823g DVP 6cm; twin B transverse spine down 867g DVP 7cm    She received a dose of rescue betamethasone at 0230 on 12/27. Repeat SVE at outside hospital was unchanged. She was started on tocolysis with indomethacin, as well as magnesium for fetal neuroprotection.     On arrival to [place], patient reports that she is feeling overall well. She denies ongoing contractions. She did notice pink tinged discharge prior to presentation to OSH, but denies frank vaginal bleeding. She reports good fetal movement.     Antepartum Course:  History of postpartum depression  Assessment & Plan  No active symptoms or medications    [x]  SW consulted while admitted to [place]    Dichorionic diamniotic twin gestation  Assessment & Plan  12/27 OSH: US twin A frank breech 823g DVP 6cm; twin B transverse  spine down 867g DVP 7cm    * Preterm contractions  Assessment & Plan  Patient admitted on 12/27 at a transfer from OSH. SVE at OSH 2/30% effaced/-3, unchanged on repeat exam on admission. BMZ course given 12/27-12/28. Mag had been started at OSH but discontinued during transport. Indomethacin started for tocolysis at OSH and also discontinued after [facility]. After monitoring for 24 hours, patient contractions subsided. SVE on 07/06/2018 was unchanged at 2/2.5/-3. NST was reactive [facility].     Plan:  Discharge 07/06/2018 with plan for follow up with local providers Dr. Santa LighterAndrew Yeh or [facility]. On day of discharge had long discussion with patient about return precautions. Also discussed goal to get to 37-38 weeks for delivery.     [ ]  GBS collected today 07/06/2018, will f/u results       Vitals  Temp:  [36.8 C-36.9 C] 36.9 C  Heart Rate:  [93-103] 103  *Resp:  [16] 16  BP: (92-95)/(45-50) 95/46  SpO2:  [98 %] 98 %    Physical Exam  General: In NAD  Chest: Normal work of breathing  Cardiovascular: RRR  Abdomen: Gravid uterus, non tender  Extremities: No edema    Relevant Labs, Radiology, and Other Studies  Key elements of latest CBC values... Please see Chart Review for additional result details.  Lab Results  Component Value Date    WBC Count 7.7 07/05/2018    Hemoglobin 8.9 (L) 07/05/2018    Hematocrit 26.9 (L) 07/05/2018    MCV 88 07/05/2018    Platelet Count 249 07/05/2018     Prenatal Labs  Lab Results   Component Value Date    ABO/RH(D) O POS 07/05/2018    Antibody Screen NEG 07/05/2018    Rubella Antibody 2.38 (H) 06/22/2018    Hepatitis B Surface Antigen NEG 06/22/2018     ID Labs (Last result from the past 8760 hours)      12/14 0200    Hepatitis B Surface Antigen       NEG       HIV Ag/Ab Combo       NEG  Comment:  (Reported by Lab to [employer]. Physician reporting also required.)            Order Current Status    Streptococcus  Group B Culture In process        Procedures Performed and Complications  Please see hospital course above.    Discharge Diet  Regular Diet    Functional Assessment at Discharge/Activity Goals  Return slowly to activities as tolerated.    Allergies and Medications at Discharge    Allergies: Patient has no known allergies.    Pending Tests   N/A    Outside Follow-up     Outside Follow Up: with primary OB     Booked [place] Appointments  No future appointments.    Pending [place] Referrals  None    Case Management Services Arranged  Case Management Services Arranged: (all recorded)         Discharge Assessment  Condition at discharge:  good  Final Discharge Disposition: Home or Self Care    Primary Care Physician  Not Confirmed Provider  Address:     Phone: None  Fax: None     Outside Providers, for pending tests please use the following numbers:   For [facility] - Please Call: [phone]    For [facility] - Please Call: [phone]   For [facility] - Please Call: [phone]    Signed,  Telford Nab, MD  07/05/2018        Additional Discharge Instructions Provided to the Patient (if any):    Discharge Instructions     Preterm Labor: Care Instructions  Your Care Instructions    Preterm labor is the start of labor between 20 and 36 weeks of pregnancy. Most babies are born at 42 to 42 weeks of pregnancy. In labor, the uterus contracts to open the cervix. This is the first stage of childbirth. Preterm labor can be caused by a problem with the baby, the mother, or both. Often the cause is not known.  In some cases, doctors use medicines to try to delay labor until 34 or more weeks of pregnancy. By this time, a baby has grown enough so that problems are not likely. In some casessuch as with a serious infectionit is healthier for the baby to be born early. Your treatment will depend on how far along you are in your pregnancy and on your health and your baby's health.  Follow-up care is a key  part of your treatment and safety. Be sure to make and go to all appointments, and call your doctor if you are having problems. It's also a good idea to know your test results and keep a list of the medicines  you take.  How can you care for yourself at home?   If your doctor prescribed medicines, take them exactly as directed. Call your doctor if you think you are having a problem with your medicine.   Rest until your doctor advises you about activity. He or she will tell you if you should stay in bed most of the time. You may need to arrange for child care if you have young children.   Do not have sexual intercourse unless your doctor says it is safe.   Use pads, not tampons, if you have vaginal bleeding.   Make sure to drink plenty of fluids. Dehydration can lead to contractions. If you have kidney, heart, or liver disease and have to limit fluids, talk with your doctor before you increase the amount of fluids you drink.   Do not smoke or allow others to smoke around you. If you need help quitting, talk to your doctor about stop-smoking programs and medicines. These can increase your chances of quitting for good.  When should you call for help?  Call 911 anytime you think you may need emergency care. For example, call if:    You passed out (lost consciousness).     You have a seizure.     You have severe vaginal bleeding.     You have severe pain in your belly or pelvis.     You have had fluid gushing or leaking from your vagina and you know or think the umbilical cord is bulging into your vagina. If this happens, immediately get down on your knees so your rear end (buttocks) is higher than your head. This will decrease the pressure on the cord until help arrives.   Call your doctor now or seek immediate medical care if:    You have signs of preeclampsia, such as:  ? Sudden swelling of your face, hands, or feet.  ? New vision problems (such as dimness, blurring, or seeing spots).  ? A severe  headache.     You have any vaginal bleeding.     You have belly pain or cramping.     You have a fever.     You have had regular contractions (with or without pain) for an hour. This means that you have 6 or more within 1 hour after you change your position and drink fluids.     You have a sudden release of fluid from the vagina.     You have low back pain or pelvic pressure that does not go away.     You notice that your baby has stopped moving or is moving much less than normal.   Watch closely for changes in your health, and be sure to contact your doctor if you have any problems.  Where can you learn more?  Go to [employer]https://www.healthwise.net/patientEd.  Enter Q400 in the search box to learn more about "Preterm Labor: Care Instructions."  Current as of: Dec 05, 2017  Content Version: 12.2   2006-2019 Healthwise, Incorporated. Care instructions adapted under license by your healthcare professional. If you have questions about a medical condition or this instruction, always ask your healthcare professional. Healthwise, Incorporated disclaims any warranty or liability for your use of this information.                 Patient Instructions    None

## 2018-07-06 NOTE — Discharge Instructions (Signed)
Preterm Labor: Care Instructions  Your Care Instructions    Preterm labor is the start of labor between 20 and 36 weeks of pregnancy. Most babies are born at 37 to 42 weeks of pregnancy. In labor, the uterus contracts to open the cervix. This is the first stage of childbirth. Preterm labor can be caused by a problem with the baby, the mother, or both. Often the cause is not known.  In some cases, doctors use medicines to try to delay labor until 34 or more weeks of pregnancy. By this time, a baby has grown enough so that problems are not likely. In some casessuch as with a serious infectionit is healthier for the baby to be born early. Your treatment will depend on how far along you are in your pregnancy and on your health and your baby's health.  Follow-up care is a key part of your treatment and safety. Be sure to make and go to all appointments, and call your doctor if you are having problems. It's also a good idea to know your test results and keep a list of the medicines you take.  How can you care for yourself at home?   If your doctor prescribed medicines, take them exactly as directed. Call your doctor if you think you are having a problem with your medicine.   Rest until your doctor advises you about activity. He or she will tell you if you should stay in bed most of the time. You may need to arrange for child care if you have young children.   Do not have sexual intercourse unless your doctor says it is safe.   Use pads, not tampons, if you have vaginal bleeding.   Make sure to drink plenty of fluids. Dehydration can lead to contractions. If you have kidney, heart, or liver disease and have to limit fluids, talk with your doctor before you increase the amount of fluids you drink.   Do not smoke or allow others to smoke around you. If you need help quitting, talk to your doctor about stop-smoking programs and medicines. These can increase your chances of quitting for good.  When should you call for  help?  Call 911 anytime you think you may need emergency care. For example, call if:    You passed out (lost consciousness).     You have a seizure.     You have severe vaginal bleeding.     You have severe pain in your belly or pelvis.     You have had fluid gushing or leaking from your vagina and you know or think the umbilical cord is bulging into your vagina. If this happens, immediately get down on your knees so your rear end (buttocks) is higher than your head. This will decrease the pressure on the cord until help arrives.   Call your doctor now or seek immediate medical care if:    You have signs of preeclampsia, such as:  ? Sudden swelling of your face, hands, or feet.  ? New vision problems (such as dimness, blurring, or seeing spots).  ? A severe headache.     You have any vaginal bleeding.     You have belly pain or cramping.     You have a fever.     You have had regular contractions (with or without pain) for an hour. This means that you have 6 or more within 1 hour after you change your position and drink fluids.       You have a sudden release of fluid from the vagina.     You have low back pain or pelvic pressure that does not go away.     You notice that your baby has stopped moving or is moving much less than normal.   Watch closely for changes in your health, and be sure to contact your doctor if you have any problems.  Where can you learn more?  Go to https://www.healthwise.net/patientEd.  Enter Q400 in the search box to learn more about "Preterm Labor: Care Instructions."  Current as of: Dec 05, 2017  Content Version: 12.2   2006-2019 Healthwise, Incorporated. Care instructions adapted under license by your healthcare professional. If you have questions about a medical condition or this instruction, always ask your healthcare professional. Healthwise, Incorporated disclaims any warranty or liability for your use of this information.

## 2018-07-06 NOTE — Progress Notes (Signed)
OBSTETRICS LABOR ANTEPARTUM PROGRESS NOTE     Completed by: Resident with Attending Attestation    Events  Admitted yesterday, unchanged and stable SVE 2/30%/-3  BMZ #2 overnight at 0230     Subjective  Patient feeling well. No painful contractions, loss of fluid, or vaginal bleeding. Good FM. Questions about what would make her eligible for a vaginal delivery.     Vitals  Temp:  [36.8 C-36.9 C] 36.8 C  Heart Rate:  [90-103] 103  *Resp:  [16-18] 16  BP: (92-93)/(45-54) 93/45  No intake or output data in the 24 hours ending 07/06/18 1019    Physical Exam  General: In NAD  Chest: Normal work of breathing  Cardiovascular: regular rate, normal pulses  Abdomen: Gravid uterus, non tender  Extremities: No edema    Cervix:  *Dilation: 2  *Effacement (cm): >2.5  *Station: -3  Consistency / Position: Posterior    Membrane Status:   Intact    Fetal Heart Rate Tracing:  Baby A  Baseline rate: 145 bpm  Variability: Moderate  FHR Changes: Accelerations     Baby B  Baseline rate: 145 bpm  Variability: Moderate  FHR Changes: Accelerations     Tocometer:   Uterine Activity  $ Mode: (P) Toco;Palpation;Applied  Contraction Frequency: 0  Contraction Duration: 40-50  Contraction Quality: Mild  Resting Tone Palpated: Soft    Fetus Presentation:  12/27 A Breech, B transverse     NST Interpretation: reactive    Data  Labs Reviewed and Significant for:  CBC        07/05/18  0900   WBC 7.7   HGB 8.9*   HCT 26.9*   PLT 249       Assessment/Plan:  Pt is a 22 y.o. G2P1001 at 350w4d admitted with concern for preterm labor with stable SVE, plan for d/c today.     Preterm contractions  Assessment & Plan  Comfortable on admission with no contractions. SVE at OSH 2/30% effaced/-3, unchanged on repeat. BMZ#1 given 0230 12/27. Mag had been started but discontinued during transport. Indomethacin started for tocolysis at OSH.    [x]  BMZ complete  [x]  SVE unchanged this morning 2/2.5/-3  [ ]  NST BID  [ ]  GBS collected today 07/06/2018, f/u     History  of postpartum depression  Assessment & Plan  No active symptoms or medications    [ ]  SW prn     Dichorionic diamniotic twin gestation  Assessment & Plan  12/27 OSH: US twin A frank breech 823g DVP 6cm; twin B transverse spine down 867g DVP 7cm    Delaney MeigsAmanda Josephine Malissa Slay, MD   07/06/18

## 2018-07-08 ENCOUNTER — Encounter (HOSPITAL_COMMUNITY): Payer: Self-pay

## 2018-07-08 ENCOUNTER — Ambulatory Visit (HOSPITAL_COMMUNITY): Payer: Medicaid Other | Attending: Advanced Practice Midwife

## 2018-07-09 LAB — STREPTOCOCCUS GROUP B CULTURE

## 2018-08-06 NOTE — Telephone Encounter (Signed)
See separate MFM fellow note

## 2018-08-06 NOTE — Telephone Encounter (Signed)
22yo G2P1 at [redacted]w[redacted]d with di-di w    S/p BMZ 24wks and 26wks  q4-5 min contractions     2-3/50%/-2   On MgSO4, Amp     A - cephalic, 1579g (24%SLP), B - breech, 1814g (56%ile)   DVPs 3-4cm

## 2018-08-06 NOTE — ED Notes (Signed)
Pt dilated to 2 cm

## 2018-08-06 NOTE — Telephone Encounter (Signed)
Telephone Note    Received transfer call from provider at [facility] [place] regarding this patient.     Kristin Lee is a 23 year old gravida 2 para 1-0-0-1 currently at 31 weeks 0 days in a dichorionic, diamniotic twin pregnancy. She has previously been admitted to our service x 2 (Dec 14-16 and Dec 27-28) with preterm labor, arrested each time, most recently at 2/30/-3 on Dec 28. On each admission, she received a full course of BMZ to reduce the risks of prematurity.    She presented to her local hospital today with preterm contractions. Cervical on arrival was 2-3 cm dilated, 50% effaced, -2 station. She has been on ampicillin for GBS prophylaxis and magnesium sulfate for fetal neuroprotection. Repeat exam 2 hours later was unchanged. Fetal monitoring has been AGA. Baby A is cephalic and baby B is breech. The patient continue to contract, although palpable to patient mostly when ambulating.    Given stable cervical exam, safe for transport at this time. L&D, RN, and ICN teams aware. [facility] will arrange transport.     Caroll Rancher, MD  Clinical Fellow  Maternal Fetal Medicine  Pager [phone]

## 2018-08-07 ENCOUNTER — Inpatient Hospital Stay
Admission: TF | Admit: 2018-08-07 | Discharge: 2018-08-10 | Disposition: A | Discharge status: Home / Self Care | Payer: PRIVATE HEALTH INSURANCE | Source: Intra-hospital | Attending: Physician | Admitting: Physician

## 2018-08-07 DIAGNOSIS — Z8659 Personal history of other mental and behavioral disorders: Secondary | ICD-10-CM

## 2018-08-07 DIAGNOSIS — O99413 Diseases of the circulatory system complicating pregnancy, third trimester: Secondary | ICD-10-CM

## 2018-08-07 DIAGNOSIS — R0602 Shortness of breath: Secondary | ICD-10-CM

## 2018-08-07 DIAGNOSIS — E860 Dehydration: Secondary | ICD-10-CM

## 2018-08-07 DIAGNOSIS — O30043 Twin pregnancy, dichorionic/diamniotic, third trimester: Secondary | ICD-10-CM

## 2018-08-07 DIAGNOSIS — O321XX2 Maternal care for breech presentation, fetus 2: Secondary | ICD-10-CM

## 2018-08-07 DIAGNOSIS — R0789 Other chest pain: Secondary | ICD-10-CM

## 2018-08-07 DIAGNOSIS — O26893 Other specified pregnancy related conditions, third trimester: Secondary | ICD-10-CM

## 2018-08-07 DIAGNOSIS — O99613 Diseases of the digestive system complicating pregnancy, third trimester: Secondary | ICD-10-CM

## 2018-08-07 DIAGNOSIS — D649 Anemia, unspecified: Secondary | ICD-10-CM

## 2018-08-07 DIAGNOSIS — O99283 Endocrine, nutritional and metabolic diseases complicating pregnancy, third trimester: Secondary | ICD-10-CM

## 2018-08-07 DIAGNOSIS — O30049 Twin pregnancy, dichorionic/diamniotic, unspecified trimester: Secondary | ICD-10-CM

## 2018-08-07 DIAGNOSIS — O365931 Maternal care for other known or suspected poor fetal growth, third trimester, fetus 1: Secondary | ICD-10-CM

## 2018-08-07 DIAGNOSIS — Z8759 Personal history of other complications of pregnancy, childbirth and the puerperium: Secondary | ICD-10-CM

## 2018-08-07 DIAGNOSIS — Z3A31 31 weeks gestation of pregnancy: Secondary | ICD-10-CM

## 2018-08-07 DIAGNOSIS — K219 Gastro-esophageal reflux disease without esophagitis: Secondary | ICD-10-CM

## 2018-08-07 DIAGNOSIS — R002 Palpitations: Secondary | ICD-10-CM

## 2018-08-07 LAB — ECG 12 LEAD UNIT PERFORMED
Atrial Rate: 94 {beats}/min
Calculated P Axis: 27 degrees
Calculated R Axis: 63 degrees
Calculated T Axis: 17 degrees
P-R Interval: 126 ms
QRS Duration: 82 ms
QT Interval: 378 ms
QTcb: 472 ms
Ventricular Rate: 94 {beats}/min

## 2018-08-07 LAB — URINALYSIS WITH REFLEX TO CULT
Bilirubin, Urine: NEGATIVE
Glucose, (UA): NEGATIVE mg/dL
Ketones, UA: 20 mg/dL — AB
Nitrite: NEGATIVE
Protein, UA: NEGATIVE mg/dL
RBCs, urine: <3 /HPF (ref <3)
Specific Gravity: 1.003 (ref 1.002–1.030)
Urobilinogen: NEGATIVE mg/dL(EU/dL)
WBC Esterase: NEGATIVE
WBCs, UR: <5 /HPF (ref <5)
pH, UA: 7 (ref 4.5–8.0)

## 2018-08-07 LAB — COMPLETE BLOOD COUNT
Hematocrit: 26.9 % — ABNORMAL LOW (ref 36–46)
Hemoglobin: 8.6 g/dL — ABNORMAL LOW (ref 12.0–15.5)
MCH: 26.8 pg (ref 26–34)
MCHC: 32 g/dL (ref 31–36)
MCV: 84 fL (ref 80–100)
Platelet Count: 245 10*9/L (ref 140–450)
RBC Count: 3.21 10*12/L — ABNORMAL LOW (ref 4.0–5.2)
WBC Count: 9.2 10*9/L (ref 3.4–10)

## 2018-08-07 LAB — TYPE AND SCREEN
ABO/RH(D): O POS
Antibody Screen: NEGATIVE

## 2018-08-07 LAB — TREPONEMAL ANTIBODY ONLY: Treponemal Antibody Only: NONREACTIVE

## 2018-08-07 MED ORDER — DOCUSATE SODIUM 250 MG CAPSULE
250 mg | ORAL | Status: DC
  Administered 2018-08-07 – 2018-08-09 (×4): 250 mg via ORAL
  Administered 2018-08-09: 06:00:00 via ORAL
  Administered 2018-08-10: 05:00:00 250 mg via ORAL

## 2018-08-07 MED ORDER — DIPHENHYDRAMINE 25 MG CAPSULE: 25 mg | ORAL | Status: DC | PRN

## 2018-08-07 MED ORDER — ACETAMINOPHEN 325 MG TABLET: 325 mg | ORAL | Status: DC | PRN

## 2018-08-07 MED ORDER — SIMETHICONE 80 MG CHEWABLE TABLET: 80 mg | ORAL | Status: DC | PRN

## 2018-08-07 MED ORDER — CALCIUM CARBONATE 200 MG CALCIUM (500 MG) CHEWABLE TABLET: 500 mg (200 mg elemental) | ORAL | Status: DC | PRN

## 2018-08-07 MED ORDER — LACTATED RINGERS INTRAVENOUS SOLUTION
INTRAVENOUS | Status: DC
  Administered 2018-08-07: 10:00:00 75 mL/h via INTRAVENOUS

## 2018-08-07 MED ORDER — MAGNESIUM SULFATE 4 GRAM/50 ML (8 %) IN WATER INTRAVENOUS PIGGYBACK
4 g/50 mL | INTRAVENOUS | Status: DC
  Administered 2018-08-07: 10:00:00 1 g/h via INTRAVENOUS
  Administered 2018-08-07 (×2): via INTRAVENOUS

## 2018-08-07 MED ORDER — DIPHENHYDRAMINE 50 MG/ML INJECTION SOLUTION: 50 mg/mL | INTRAMUSCULAR | Status: DC | PRN

## 2018-08-07 MED ORDER — LIDOCAINE (PF) 10 MG/ML (1 %) INJECTION SOLUTION: 10 mg/mL (1 %) | INTRAMUSCULAR | Status: DC | PRN

## 2018-08-07 MED ORDER — BISACODYL 5 MG TABLET,DELAYED RELEASE: 5 mg | ORAL | Status: DC | PRN

## 2018-08-07 MED ORDER — ONDANSETRON HCL (PF) 4 MG/2 ML INJECTION SOLUTION: 4 mg/2 mL | INTRAMUSCULAR | Status: DC | PRN

## 2018-08-07 MED ORDER — DIPHENHYDRAMINE-LIDOCAINE-MAALOX MWSH: OROMUCOSAL | Status: DC

## 2018-08-07 MED ORDER — PRENATAL VIT,CALCIUM 27-FERROUS FUM 60 MG IRON-FOLIC ACID 1 MG TABLET
60 mg iron-1 mg | ORAL | Status: DC
  Administered 2018-08-07 – 2018-08-09 (×3): 1 via ORAL
  Administered 2018-08-10: 18:00:00 via ORAL

## 2018-08-07 MED ORDER — FAMOTIDINE 20 MG TABLET
20 mg | ORAL | Status: DC
  Administered 2018-08-08 – 2018-08-10 (×3): 20 mg via ORAL

## 2018-08-07 MED FILL — VINATE ONE 60 MG IRON-1 MG TABLET: 60 mg iron-1 mg | ORAL | Qty: 1

## 2018-08-07 MED FILL — STOOL SOFTENER 250 MG CAPSULE: 250 mg | ORAL | Qty: 1

## 2018-08-07 MED FILL — MAGNESIUM SULFATE 4 GRAM/50 ML (8 %) IN WATER INTRAVENOUS PIGGYBACK: 4 g/50 mL | INTRAVENOUS | Qty: 100

## 2018-08-07 MED FILL — FAMOTIDINE 20 MG TABLET: 20 mg | ORAL | Qty: 1

## 2018-08-07 MED FILL — DIPHENHYDRAMINE-LIDOCAINE-MAALOX MWSH: OROMUCOSAL | Qty: 10

## 2018-08-07 MED FILL — MAGNESIUM SULFATE 4 GRAM/50 ML (8 %) IN WATER INTRAVENOUS PIGGYBACK: 4 g/50 mL | INTRAVENOUS | Qty: 50

## 2018-08-07 NOTE — Progress Notes (Addendum)
OBSTETRICS LABOR ANTEPARTUM PROGRESS NOTE     Completed by: Resident with Attending Attestation    Subjective  The patient reports feeling well. She expressed concern that she is now 4 cm dilated. Notes that the cramping/contractions have improved. However, she states that when she experiences a contraction, she also has chest pain that is described as pressure like. These symptoms are also associated with SOB and also reported to have mild dizziness when she ambulates. Currently asymptomatic. Has a h/o  GERD and believes this discomfort is different.     Vitals  Temp:  [36.8 C-37.2 C] 37.2 C  Heart Rate:  [87-111] 89  *Resp:  [16-18] 16  BP: (79-109)/(38-59) 108/59  SpO2:  [97 %-100 %] 99 %    Intake/Output Summary (Last 24 hours) at 08/07/2018 1732  Last data filed at 08/07/2018 1700  Gross per 24 hour   Intake 2343.13 ml   Output 1850 ml   Net 493.13 ml       Physical Exam  General: NAD  Respiratory: Clear to auscultation bilaterally.   Cardiovascular: RRR. +2/6 systolic flow murmur.   Abdomen: Soft. Gravid  Extremities: Symmetric lower extremities with no edema    Cervix:  *Dilation: 4  *Effacement (cm): 1.5  *Station: -3    Membrane Status:       Fetal Heart Rate Tracing:  Baseline rate: 130 bpm  Variability: Moderate  FHR Changes: Accelerations    Tocometer:   Uterine Activity  $ Mode: Toco  Contraction Frequency: 10-15 c irrit  Contraction Duration: 90-120  Contraction Quality: Mild  Resting Tone Palpated: Soft    Fetus Presentation:        NST Interpretation: reactive    Data  Labs Reviewed and Significant for:  CBC        08/07/18  0115   WBC 9.2   HGB 8.6*   HCT 26.9*   PLT 245       Assessment/Plan:  Pt is a 23 y.o. G2P1001 at [redacted]w[redacted]d admitted with di/di twins in s/o c/f PTL.    #Preterm labor: SVE 2-3/50/-3 at outside hospital. On admission at 0115 4/1.5/-3, stable at 0515 and patient contracting every 10 minutes, able to sleep.   [ ]  Plan to discontinue Magnesium  --PCN discontinued, pt is GBS negative  but expires on 08/10/18; repeat would likely be invalid at this time given recent antibiotics  [ ]  F/u UCx, GC/CT  [ ]  Regular diet    #Atypical chest pain: left and central prompted by contractions, with left chest wall tenderness. Slightly pleuritic. No SOB. Normal EKG, vitals, O2 sat and tenderness on exam make cardiac or pulmonary etiology unlikely. Given her symptoms,however, will proceed with additional w/u to r/o CHF.  [ ]  f/u BNP  [ ]  f/u TTE  [ ]  CTM sx, O2 sat    #FWB/di-di twins: A 1579g B 1814g (13% discordant). Patient IS a candidate for vaginal delivery and breech extraction. BMZ 12/14-12/15, 12/27-28     Fabio Bering, MD 08/07/18     [facility]  [facility]    INPATIENT ATTENDING ATTESTATION     Attestation  - My date of service is 08/07/2018  - I was present for and performed key portions of an examination of the patient.  - I am personally involved in the management of the patient.    I agree with the findings and care plans as documented.    Additional Attending Services (Beyond Usual Care):  None  Signing Provider    Hedy Camara, MD  08/07/2018

## 2018-08-07 NOTE — Anesthesia Pre-Procedure Evaluation (Addendum)
ANESTHESIA PRE-OP H&P      Anesthesia Encounter History    I have assessed Shared Data in APeX as listed in the Pre-Op Extract report.    CC/HPI/Past Medical History Summary: 23 y.o. G2P1001 at [redacted]w[redacted]d with di-di twins admitted to antepartum for c/f preterm labor    (Please refer to APeX Allergies, Problems, Past Medical History, Past Surgical History, Social History, and Family History activities, Results for current data from these respective sections of the chart; these sections of the chart are also summarized in reports, including the Patient Summary Extracts found in Chart Review)      Summary of Outside Records:  ~~~~~~~~~~~~~~~~~~~~~~~~    No recent BMP:  Na    K   Cr   12/27 0900:  WBC 7.7  Hgb 8.9*  Plt 249    No results found for: PT, INR      ~~~~~~~~~~~~~~~~~~~~~~~~  Summary of Prior Anesthetics: Previous anesthetic without AAC  GETA for knee surgery, NAAC        Review of Systems   Functional Status: Climb a flight of stairs or walk up a hill (5.50 METs)   Constitutional: Negative for chills, diaphoresis and fever.   Airway: Negative.  Negative for loose teeth and Dental Hardware  Eyes: Negative for visual disturbance.   Respiratory: Negative.  Negative for Recent URI Symptoms and shortness of breath.    Cardiovascular: Positive for orthopnea. Negative for chest pain (Radiating with contractions).   Gastrointestinal: Negative for nausea and Negative for GERD symptoms.   Neurological: Positive for numbness (In hands). Negative for headaches.   All other systems reviewed and are negative.      Physical Exam    Airway: Modified Mallampati score: I. Thyromental distance: 4 cm - 6.5 cm. Mouth opening: good. Neck range of motion: full.   Constitutional:       Appearance: She is well-developed and well-nourished. obese  HENT:      Head: Normocephalic and atraumatic.   Eyes:      Conjunctiva/sclera: Conjunctivae normal.   Cardiovascular:      Rate and Rhythm: Normal rate and regular rhythm.      Heart sounds:  Normal heart sounds.   Pulmonary:      Effort: Pulmonary effort is normal. No respiratory distress.      Breath sounds: Normal breath sounds. No wheezing or rales.   Skin:     General: Skin is warm and dry.   Neurological:      Mental Status: She is alert and oriented to person, place, and time.      Comments: Speaking in full English sentences. No facial droop. Fingertaps fast and symmetric. No pronator drift. SILT in bilateral lower extremities. EHL 5/5.   Psychiatric:         Mood and Affect: Mood and affect normal.     Dental: no poor dentition       Prepare (Pre-Operative Clinic) Assessment/Plan/Narrative      [place] information      Anesthesia Assessment and Plan  ASA 2   Anesthesia Plan  Anesthesia Type: general, spinal and epidural  Invasive Monitors/Vascular Access: None  Anesthesia Potential Complication Discussion  At increased or higher than average risk of: Nerve injury and Postural headache  There is the possibility of rare but serious complications.  Informed Consent for Anesthesia  Consent obtained from patient    Risks, benefits and alternatives including those of invasive monitoring discussed. Increased risks (as above) discussed.  Questions invited  and all answered.  Interpreter: N/A - patient/guardian's preferred language is [place].  Consent granted for anesthetic plan    (See Anesthesia Record for attending attestation)    [Please note, smart link data included in this note may not reflect changes since note creation. Please see appropriate section of APeX for up-to-the minute information.]

## 2018-08-07 NOTE — H&P (Signed)
OBSTETRICS H&P NOTE     Completed by Resident with Attending Attestation.    History of Present Illness  Kristin Lee is a 23 y.o. G2P1001 at [redacted]w[redacted]d. She presents with preterm contractions/preterm labor.     The patient reports approximately 24 hr ago (at 3 am on 08/06/18) she began to experience contractions, mostly in her back. She did have one small spot of bleeding with wiping at 1100 on 08/06/18. She has not had any leakage of fluid. She feels that she has chest pain when she has a contraction, radiating from her belly to her neck. This sensation is not associated with SOB, although she feels generally more short of breath as the pregnancy progresses in the form of a difficulty taking a deep breath.     At the outside hospital, she was placed on Mg and PCN. Her exam was 2-3/50/-2 and she was transferred for ongoing management.  Her most recent ultrasound on 08/06/18 showed Twin A was 1579g (18%), Twin B was 1814g (56.0%) - 13% discordant.     Patient Active Problem List    Diagnosis Date Noted    Preterm labor 08/07/2018    Preterm contractions 07/05/2018    Dichorionic diamniotic twin gestation 06/22/2018    Pregnancy with prenatal care elsewhere 06/22/2018    History of postpartum depression 06/22/2018      Prenatal Labs  Lab Results   Component Value Date    ABO/RH(D) O POS 07/05/2018    Antibody Screen NEG 07/05/2018    Rubella Antibody 2.38 (H) 06/22/2018    Hepatitis B Surface Antigen NEG 06/22/2018    HIV Ag/Ab Combo NEG 06/22/2018     Dating  FINAL EDD: Estimated Date of Delivery: 10/08/18     Prenatal Care  [place], [place]    History reviewed. No pertinent past medical history.  Past Surgical History:   Procedure Laterality Date    KNEE SURGERY         OB History   Gravida Para Term Preterm AB Living   2 1 1     1    SAB TAB Ectopic Molar Multiple Live Births             1      # Outcome Date GA Lbr Len/2nd Weight Sex Delivery Anes PTL Lv   2 Current            1 Term    3.402 kg (7 lb 8 oz)     LIV        There is no immunization history on file for this patient.    Allergies: Patient has no known allergies.     No medications prior to admission.       Social History     Socioeconomic History    Marital status: Single     Spouse name: None    Number of children: None    Years of education: None    Highest education level: None   Occupational History    None   Social Engineer, site strain: None    Food insecurity:     Worry: None     Inability: None    Transportation needs:     Medical: None     Non-medical: None   Tobacco Use    Smoking status: Never Smoker    Smokeless tobacco: Never Used   Substance and Sexual Activity    Alcohol use: Not Currently     Frequency: Never  Drug use: Never    Sexual activity: Yes   Lifestyle    Physical activity:     Days per week: None     Minutes per session: None    Stress: None   Relationships    Social connections:     Talks on phone: None     Gets together: None     Attends religious service: None     Active member of club or organization: None     Attends meetings of clubs or organizations: None     Relationship status: None    Intimate partner violence:     Fear of current or ex partner: None     Emotionally abused: None     Physically abused: None     Forced sexual activity: None   Other Topics Concern    None   Social History Narrative    Lives with her mother, son and FOB. She and her family just bought a home in [place], [place]. Stay at home mother.           History reviewed. No pertinent family history.    Review of Systems  All other systems reviewed and are negative.    Vitals  BP: (103)/(59) 103/59  SpO2:  [100 %] 100 %       Physical Examination  Gen: well appearing, no acute distress  Chest: tender at left chest wall on palpation   Pulm: normal effort  Abd: soft, nontender, nondistended with no rebound or guarding  Extr: symmetric without pitting edema    Cervix (0130, 0530):  *Dilation: 4  *Effacement (cm): 1.5  *Station:  -3    Membrane Status: intact    Fetus Presentation:   A: cephalic   B: breech    Fetal Heart Rate Tracing:  A: reactive   B: reactive     Tocometer:   q10 mins    Data  GBS negative (07/06/18) - expires 08/10/18    EKG: normal sinus rhythm     Problem-based Assessment & Plan    Kristin Lee is a 23 y.o. G2P1001 at [redacted]w[redacted]d with di-di twins presenting for preterm labor.     #Preterm labor: SVE 2-3/50/-3 at outside hospital. On admission at 0115 4/1.5/-3, stable at 0515 and patient contracting every 10 minutes, able to sleep.   --Continue Mg until day team assessment but likely can turn off if another stable exam  --PCN discontinued, pt is GBS negative but expires on 08/10/18; repeat would likely be invalid at this time given recent antibiotics  [ ]  F/u UCx, GC/CT    #Atypical chest pain: left and central prompted by contractions, with left chest wall tenderness. Slightly pleuritic. No SOB. Normal EKG, vitals, O2 sat and tenderness on exam make cardiac or pulmonary etiology unlikely. No additional workup planned at this time.     #FWB/di-di twins: A 1579g B 1814g (13% discordant). Patient IS a candidate for vaginal delivery and breech extraction. BMZ 12/14-12/15, 12/27-28     Corliss Marcus, MD  08/07/2018

## 2018-08-07 NOTE — Progress Notes (Signed)
Date and Time of Service: 08/07/18 17:00-17:20    Presentation: twins, preterm labor    Service provided:  Left side lying back massage        Response:   Pain:2 to 0  Anxiety: 0  Nausea: 0  Tension:0  Comment:"Thank you so much :)"      Signed: Dala Dock, CMT

## 2018-08-07 NOTE — Progress Notes (Signed)
R2 Antepartum Note     Patient with questions about discharge and delivery plan. Discussed that morning team would readdress discharge plan. But that plan for tomorrow is a formal US. Discussed that based off of her last Korea at the OSH on 1/28 with both twin A and B >1500g, she could likely be a candidate for a twin vaginal delivery, possible breech extraction. Discussed we would try to get her housing at the [employer].     Also discussed rationale for when we would do another SVE. Not at this time.     Husband ask for work letter due to missing flight for admission.     All questions answered.     Lavone Nian, MD   [facility], Obstetrics & Gynecology  08/07/18

## 2018-08-07 NOTE — Nursing Note (Signed)
Pt states chest pain occurs with UCs only. Visually looks uncomfortable with pain/pressure up to her neck. States she occ. Has heatburn and reflux but this is different. Pulse Ox while standing at bedside and walking in room results in 10 beat increase while ambulating, normal Sat. Echo ordered by MD.

## 2018-08-08 DIAGNOSIS — Z3A30 30 weeks gestation of pregnancy: Secondary | ICD-10-CM

## 2018-08-08 LAB — TRANSTHORACIC ECHO
LV Mass Index (BSA) by M-Mode: 66.8268
LVEDVi: 44.0443
LVEF by MOD Bi-plane: 48.1187
LVESVi: 22.8507
RV Systolic Pressure: 21.781

## 2018-08-08 LAB — MRSA CULTURE

## 2018-08-08 LAB — B-TYPE NATRIURETIC PEPTIDE: BNP: 198 pg/mL — ABNORMAL HIGH (ref <48)

## 2018-08-08 MED ORDER — FERROUS SULFATE 325 MG (65 MG IRON) TABLET
325 mg (65 mg elemental) | ORAL | Status: DC
  Administered 2018-08-09 – 2018-08-10 (×2): via ORAL

## 2018-08-08 MED FILL — STOOL SOFTENER 250 MG CAPSULE: 250 mg | ORAL | Qty: 1

## 2018-08-08 NOTE — Progress Notes (Signed)
OBSTETRICS LABOR ANTEPARTUM PROGRESS NOTE     Completed by: Resident with Attending Attestation    Subjective  The patient reports feeling well. She reports that she continues to have intermittent contractions, however they are described as only moderately uncomfortable. The chest pain has since resolved and she feels it may have been associated with MgSO4.She reports good fetal movement and denies vaginal bleeding, loss of fluid.     Vitals  Temp:  [36.5 C-36.7 C] 36.7 C  Heart Rate:  [87-109] 88  *Resp:  [16] 16  BP: (98-108)/(47-59) 105/53  SpO2:  [97 %-99 %] 99 %    Intake/Output Summary (Last 24 hours) at 08/08/2018 0945  Last data filed at 08/07/2018 1700  Gross per 24 hour   Intake 845.42 ml   Output 1400 ml   Net -554.58 ml       Physical Exam  General: NAD  Respiratory: No increased work of breathing.  Cardiovascular:  Normal peripheral perfusion  Abdomen: Soft. Gravid  Extremities: Symmetric lower extremities with no edema    Cervix:  *Dilation: 4  *Effacement (cm): 1.5  *Station: -3    Membrane Status:    intact    Fetal Heart Rate Tracing:  Baseline rate: 135 bpm  Variability: Moderate  FHR Changes: Accelerations    Tocometer:   Uterine Activity  $ Mode: Toco;Applied  Contraction Frequency: irritability  Contraction Duration: 60-90  Contraction Quality: Mild  Resting Tone Palpated: Soft    Fetus Presentation:  A cephalic, B breech on 1/28      NST Interpretation: reactive    Data  Labs Reviewed and Significant for:  CBC        08/07/18  0115   WBC 9.2   HGB 8.6*   HCT 26.9*   PLT 245       Assessment/Plan:  Pt is a 23 y.o. G2P1001 at [redacted]w[redacted]d admitted with di/di twins in s/o c/f PTL.    #Preterm labor: SVE 2-3/50/-3 at outside hospital. On admission at 0115 4/1.5/-3, stable at 0515 and patient contracting every 10 minutes, able to sleep. Started on Magnesium and discontinued after determined to be stable. UA notable for ketones, thus dehydration may be contributing to contractions.   --PCN discontinued, pt  is GBS negative but expires on 08/10/18; repeat would likely be invalid at this time given recent antibiotics  [ ]  Plan to discontinue   [ ]  Given stable findings, will repeat SVE today. If no change and Korea within normal, plan for discharge home today.   [ ]  F/u UCx, GC/CT  [ ]  Regular diet    #Atypical chest pain: left and central prompted by contractions, with left chest wall tenderness. Slightly pleuritic. No SOB. Normal EKG, vitals, O2 sat and tenderness on exam make cardiac or pulmonary etiology unlikely. Given her symptoms,however, will proceed with additional w/u to r/o CHF. BNP 198. Symptoms resolved after discontinuation of MgSO4. Overall, likely etiology is MSK vs. Chest discomfort 2/2 Magnesium sulfate  [ ]  f/u TTE final report  [ ]  CTM sx, O2 sat    #FWB/di-di twins: A 1579g B 1814g (13% discordant). Patient IS a candidate for vaginal delivery and breech extraction. BMZ 12/14-12/15, 12/27-28     The patient was seen and evaluated with Dr. Eugenie Birks (MFM Fellow) and Dr. Ernestina Columbia (MFM Attending)  Ellis [place], MD PGY-2  Department of OBGYN & RS  [facility] [place], [place]

## 2018-08-08 NOTE — Interdisciplinary (Signed)
Social Work Note  Date of Service: 08/08/2018    Please see Initial SW Assessment Note on 06/23/18 (from prior antepartum admission)      DEMOGRAPHICS  Kristin Lee is a 23 y.o. old female who was admitted to the [facility] team with di/di twins, s/o c/f PTL.  SW is continuing to provide ongoing support around the impact of [place] hospitalization.  No language interpreter was used during our meeting.       ASSESSMENT:  SW met with PT at bedside. PT known to this SW from prior antepartum admission. PT reported that FOB is currently in town, but has to return to [place] soon to retrieve the remainder of their belongings. Their home in [place] is still being prepared, but likely will be ready in the next week; PT continues to stay with her mother in [place].     SW and PT discussed disposition. PT reports that she no longer has option to stay with family friend in [place], but may be able to stay with an aunt in [place]. PT is considering staying there vs. Local lodging near [place] so that her 23 yo would have a plan to stay with another adult if she went into labor. SW discussed local lodging referral; PT understanding that 23 yo would need another adult if she had to be re-admitted to hospital. PT to discuss with FOB and aunt regarding plan and will inform this SW of family preference. In the meantime, per request of medical team, SW submitted lodging referral for 1/31. SW and PT to discuss plan prior to d/c to further assess family preference.     SW and PT also discussed coping and h/o pp depression. PT reported that her mood has improved and she feels more connected to babies now that female baby kicks for her and follows her hand. PT continues to remain worried about developing pp depression and that she "won't feel connected to them" after delivery. PT reports she is open to therapy and medication after delivery. Of note, PT declined [facility] PHN services once they contacted her, though it appears PT is open to  support after delivery.     PT also discussed some of her questions for medical team, including early induction/delivery. PT expressed some fear around response from team. With PT's permission, SW passed along questions to medical team for them to address during rounds.     SW and PT also discussed hospital resources. SW distributed discounted parking vouchers. Per PT request, SW also added FOB Kristin Lee), PT's mother Kristin Lee), and PT's younger sister Kristin Lee) to [facility] list.     PT reported no other SW needs at this time. SW to check in prior to d/c re: disposition.     PLAN:  1. SW to continue to provide ongoing psycho-social support for duration of admission.   2. SW to continue to update the multidisciplinary medical team to ongoing family needs and plan.   3. SW submitted lodging referral.  4. SW distributed discounted parking vouchers and added relatives to [facility] Day Program list.     TIME SPENT: 60 minutes for intervention and documentation    Jethro Bastos, MSW, MPH  [employer]: [phone]

## 2018-08-09 DIAGNOSIS — O99013 Anemia complicating pregnancy, third trimester: Secondary | ICD-10-CM

## 2018-08-09 LAB — CHLAMYDIA TRACHOMATIS/NEISSERI

## 2018-08-09 MED ORDER — IRON SUCROSE 100 MG IRON/5 ML INTRAVENOUS SOLUTION
100 mg iron/5 mL | INTRAVENOUS | Status: AC
  Administered 2018-08-10: 03:00:00 via INTRAVENOUS

## 2018-08-09 MED FILL — VENOFER 100 MG IRON/5 ML INTRAVENOUS SOLUTION: 100 mg iron/5 mL | INTRAVENOUS | Qty: 15

## 2018-08-09 MED FILL — FAMOTIDINE 20 MG TABLET: 20 mg | ORAL | Qty: 1

## 2018-08-09 MED FILL — STOOL SOFTENER 250 MG CAPSULE: 250 mg | ORAL | Qty: 1

## 2018-08-09 MED FILL — VINATE ONE 60 MG IRON-1 MG TABLET: 60 mg iron-1 mg | ORAL | Qty: 1

## 2018-08-09 NOTE — Interdisciplinary (Signed)
Social Work Note  Date of Service: 08/09/2018    Please see Initial SW Assessment Note on 06/23/18 (from prior antepartum admission)      DEMOGRAPHICS  Kristin Lee is a 23 y.o. old female who was admitted to the [facility] team with di/di twins, s/o c/f PTL.  SW is continuing to provide ongoing support around the impact of Kristin Lee hospitalization.  No language interpreter was used during our meeting.       ASSESSMENT:  SW met with PT at bedside. PT reported that upon d/c, she prefers to stay at her Aunt's home in [place] as her 65 yo son Kristin Lee is more familiar with that home and she would not have support persons available at our local lodging. PT is amenable to returning weekly to [place] for f/u care. PT open to local lodging as she gets closer to delivery.     SW and PT also discussed transportation to [place]. PT reported that she is unsure if anyone will be able to pick her up upon d/c. SW distributed contact number to [employer] for PT to request a ride upon d/c. PT agreed to call [employer] for transportation assistance.     PT also reported medical team agreed to write hospitalization verification letter for FOB who had to postpone his flight to [place] (originally scheduled 1/30).     PT reported no other SW needs at this time. PT has this SW contact info for further needs. SW to remain available as needed.     PLAN:  1. SW to continue to provide ongoing psycho-social support for duration of admission.   2. SW to continue to update the multidisciplinary medical team to ongoing family needs and plan.       TIME SPENT: 45 minutes for intervention and documentation    Kristin Lee, MSW, MPH  Voalte: [phone]

## 2018-08-09 NOTE — Discharge Summary (Signed)
[facility] - DISCHARGE SUMMARY     Patient Name: Kristin Lee  Patient MRN: [id number]  Date of Birth: [birth year]    Facility: [facility]  Attending Physician: No att. providers found    Date of Admission: 08/07/2018  Date of Discharge: 08/10/2018    Admission Diagnosis: Preterm labor  Discharge Diagnosis: Preterm labor    Discharge Disposition: Home    History (with Chief Complaint)    Kristin Lee a 22 y.o.G2P1001at [redacted]w[redacted]d who presented with preterm labor.     At 3 am on 08/06/18 she began to experience contractions, mostly felt in her back. She had one small spot of bleeding with wiping at 1100 on 08/06/18. She has not had any leakage of fluid. She feels that she has chest pain when she has a contraction, radiating from her belly to her neck. This sensation is not associated with SOB, although she feels generally more short of breath as the pregnancy progresses in the form of a difficulty taking a deep breath.     At the outside hospital, she was placed on Magnesium and penicillin. Her exam was 2-3/50/-2 and she was transferred to [facility] for a higher level of care. Her most recent ultrasound on 08/06/18 showed Twin A was 1579g (18%), Twin B was 1814g (56.0%) - 13% discordant.     Antepartum Course:  1. Preterm Labor  The patient has been previously admitted for c/f PTL in the past and has received two courses of betamethasone. She is now admitted for similar concerns. At the OSH, she was 2-3/50/-2 and upon arrival to [facility], was then noted to be 4/1.5/-3. On repeat SVE, there was no change, so she was taken off magnesium.   Plan: We will reach out to our schedulers to enable establishing prenatal care with [place].    2. Di/Di Twin Gestation  A 1579g B 1814g (13% discordant).Patient IS a candidate for vaginal delivery and breech extraction.BMZ 12/14-12/15, 12/27-28  Plan:  Repeat ultrasound in 2 weeks    3. Chest pain likely musculoskeletal  The pain was described as central and prompted by  contractions. The pain was reproducible upon palpation to the chest wall. ECG within normal limits. O2 sats within normal limits. Given reports of SOB and dizziness with ambulation, BNP and TTE were performed and within normal limits. The symptoms abated when magnesium was discontinued. The atypical chest pain was thought to be musculoskeletal or due to a medication side effect. Return precautions were discussed with the patient.   Plan: Return to care as needed.     4. Anemia   On admission, Hg 8.6, Hct 26.9. On 1/31, the patient was administered 325mg  ferrous sulfate and iron studies were ordered to better characterize the type of anemia.   Plan: Encouraged healthy diet and prenatal vitamins with iron.    Vitals       Physical Exam  General: no acute distress  HEENT: normocephalic, atraumatic  CV/pulm: nonlabored breathing on room air  Abdomen: gravid, soft, nontender  Extremities: warm, well perfused, symmetric  Neuro: no gross motor deficits  Psych: appropriate affect    SVE: 4/1.5/-3    Relevant Labs, Radiology, and Other Studies  2018/08/09  A dichorionic/diamniotic living twin gestation is identified.  Twin A is inferior/left, vertex, and the placenta is posterior. The placental cord insertion was not visualized.  Twin B is superior/right, breech, and the placenta is posterior.  The placental cord insertion was not visualized.  The amniotic fluid volume for  both twins is normal.  The DVP for twin A is 3.9 cm.  The DVP for twin B is 4.0 cm.   The uterus was unremarkable. The placenta has no evidence of previa. Both ovaries were visualized and were normal in appearance.      The following measurements were obtained:    Twin A:         BPD    77 mm,          31 weeks       0 days                                                                                                                                                                                                                                                         HC      291 mm,        32 weeks       0 days             AC      245 mm,        28 weeks       5 days             FL       56 mm,          29 weeks       2 days    Gestational age based on biometry is  30 weeks 2 days with an EDD  of 10/15/2018.  The EFW is 1384 grams.  This is in the 3.8 %ile.       Twin B:         BPD    81 mm,          32 weeks       5 days             HC      301 mm,        33 weeks       3 days             AC      261 mm,        30 weeks       1 days  FL       58 mm,          30 weeks       4 days    Gestational age based on biometry is  31 weeks 5 days with an EDD  of 10/05/2018. The EFW of fetus B is 1642 grams.  This is in the 24.4 %ile.      Gestational age based on patient's reported EDC of 10/08/2018 is 31 weeks 2 days.    Umbilical artery Dopplers were performed, and were normal, with an S/D of 3.13.  Umbilical artery Dopplers were performed on fetus B, and were normal, with an S/D of 3.78.      This examination included a  survey of the fetal anatomy. The survey is comprised of (but not limited to) the following views where technically possible and clinically appropriate:   Brain  parenchyma, cerebral ventricles (including 4th ventricle), cerebellum, choroid plexus, cisterna magna, midline falx, cavum septi pellucidi, and nuchal thickness  Spine - cervical, thoracic, lumbar and sacral spine, shape and curvature  Face - orbits including lens, fetal upper lip, maxilla, mandible, nose, nasal bone, profile, and neck  Heart  situs, four-chamber image of the heart, great vessels (and/or outflow tracts), 3 vessel view, superior and inferior vena cava and aortic arch.  Abdomen - diaphragm, kidneys, gallbladder, liver, stomach (left-sided), kidneys, urinary bladder, ventral abdominal wall, umbilical cord insertion into the fetal abdomen, and umbilical cord vessel number.  Extremities - legs, arms, hands and feet.      In twin A no major structural abnormalities were  identified, although visualization was limited by fetal position.  In twin B no major structural abnormalities were identified, although visualization was limited by fetal position.      Impressions:  This is a dichorionic, diamniotic twin gestation.  Both fetuses are appropriately grown for gestational age, with normal amniotic fluid pockets.       Twin A is measuring at the 4th%ile. The [facility] is at the 2nd percentile. The DVP is normal. UA dopplers are normal. Twin B is measuring at the 24th percentile with normal DVP.  There is a 16 percent size discordance between the fetuses. Recommend UA dopplers in one week and follow up growth in 2 weeks.    Prenatal Labs  Lab Results   Component Value Date    ABO/RH(D) O POS 08/07/2018    Antibody Screen NEG 08/07/2018    Rubella Antibody 2.38 (H) 06/22/2018    Hepatitis B Surface Antigen NEG 06/22/2018     ID Labs (Last result from the past 8760 hours)      06/22/18 0200    Hepatitis B Surface Antigen       NEG       HIV Ag/Ab Combo       NEG  Comment:  (Reported by Lab to [facility]. Physician reporting also required.)             Procedures Performed and Complications  Please see hospital course above.    Discharge Diet  Regular Diet    Functional Assessment at Discharge/Activity Goals  Return slowly to activities as tolerated.  Do not lift anything heavier than 15 pounds for 6 weeks.    Allergies and Medications at Discharge    Allergies: Patient has no known allergies.      Immunizations Administered for This Admission     Name Date    Influenza  08/10/2018  Pending Tests   I have reviewed the Pending Labs, Microbiology, Pathology and Radiology in the medical record and this patient has no pending tests or abnormal radiology requiring follow up.    Outside Follow-up  Outside Follow Up: None    Booked [place] Appointments  Future Appointments   Date Time Provider Department Center   08/15/2018  2:00 PM ANTENATAL TESTING PROVIDER 3 [facility]   08/19/2018  2:00 PM  ANTENATAL TESTING PROVIDER 1 [facility]   08/22/2018 11:00 AM ANTENATAL TESTING PROVIDER 1 [facility]       Pending [facility] Referrals      Case Management Services Arranged  Case Management Services Arranged: (all recorded)           Discharge Assessment  Condition at discharge:  good  Final Discharge Disposition: Home or Self Care  Primary Care Physician  Not Confirmed Provider  Address:     Phone: None  Fax: None     Outside Providers, for pending tests please use the following numbers:   For [facility] - Please Call: [phone]    For [facility] - Please Call: [phone]   For [facility] - Please Call: [phone]    Signed,  Velvet BatheNicole Sina Lucchesi, MD, MPH (PGY2)  OB/GYN&RS  2018/08/10        Additional Discharge Instructions Provided to the Patient (if any):    Discharge Instructions     You were hospitalized for preterm labor. We are discharging you because the preterm labor stopped and the fetal heart monitoring looks reassuring.     One of your twins has been growing very slowly. This is called growth restriction.     As a result of your preterm labor and growth restriction, we would like for you to return to [place] twice per week for the remainder of the pregnancy for monitoring and ultrasounds.     You should receive:   1. Antenatal Testing every Monday and Thursday, starting 2/3  2. Doppler ultrasounds every Thursday, starting 2/6  3. Growth ultrasounds every other Thursday, starting 2/13    We were not able to schedule your Dopplers and growth ultrasounds before they closed for the weekend. Please call the [facility] at your earliest convenience on Monday to schedule this appointment at [phone]. Or you can make the appointment in person when you come for antenatal testing on Monday 2/3.     You received iron during this hospitalization to treat your anemia. Please see the attached information on high-iron foods you can eat to improve your anemia.                   Patient Instructions       We will attempt to schedule your follow up at antenatal testing and ultrasounds.    Learning About When to Call Your Doctor During Pregnancy (After 20 Weeks)  Your Care Instructions  It's common to have concerns about what might be a problem during pregnancy. Although most pregnant women don't have any serious problems, it's important to know when to call your doctor if you have certain symptoms or signs of labor.  These are general suggestions. Your doctor may give you some more information about when to call.  When to call your doctor (after 20 weeks)  Call 911 anytime you think you may need emergency care. For example, call if:   You have severe vaginal bleeding.   You have sudden, severe pain in  your belly.   You passed out (lost consciousness).   You have a seizure.   You see or feel the umbilical cord.   You think you are about to deliver your baby and can't make it safely to the hospital.  Call your doctor now or seek immediate medical care if:   You have vaginal bleeding.   You have belly pain.   You have a fever.   You have symptoms of preeclampsia, such as:  ? Sudden swelling of your face, hands, or feet.  ? New vision problems (such as dimness, blurring, or seeing spots).  ? A severe headache.   You have a sudden release of fluid from your vagina. (You think your water broke.)   You think that you may be in labor. This means that you've had at least 6 contractions in an hour.   You notice that your baby has stopped moving or is moving much less than normal.   You have symptoms of a urinary tract infection. These may include:  ? Pain or burning when you urinate.  ? A frequent need to urinate without being able to pass much urine.  ? Pain in the flank, which is just below the rib cage and above the waist on either side of the back.  ? Blood in your urine.  Watch closely for changes in your health, and be sure to contact your doctor if:   You have vaginal  discharge that smells bad.   You have skin changes, such as:  ? A rash.  ? Itching.  ? Yellow color to your skin.   You have other concerns about your pregnancy.  If you have labor signs at 37 weeks or more  If you have signs of labor at 37 weeks or more, your doctor may tell you to call when your labor becomes more active. Symptoms of active labor include:   Contractions that are regular.   Contractions that are less than 5 minutes apart.   Contractions that are hard to talk through.  Follow-up care is a key part of your treatment and safety. Be sure to make and go to all appointments, and call your doctor if you are having problems. It's also a good idea to know your test results and keep a list of the medicines you take.  Where can you learn more?  Go to [employer]  Enter N531 in the search box to learn more about "Learning About When to Call Your Doctor During Pregnancy (After 20 Weeks)."  Current as of: Dec 05, 2017  Content Version: 12.3   2006-2019 [employer]. Care instructions adapted under license by your healthcare professional. If you have questions about a medical condition or this instruction, always ask your healthcare professional. [employer], Inc[employer] disclaims any warranty or liability for your use of this information.

## 2018-08-09 NOTE — Progress Notes (Addendum)
OBSTETRICS LABOR ANTEPARTUM PROGRESS NOTE     Completed by: Resident with Attending Attestation    Subjective   She denies vaginal bleeding, leakage of fluid, and contractions. She endorses good fetal movement. No acute overnight events     Vitals  Temp:  [36.6 C-36.7 C] 36.7 C  Heart Rate:  [84-96] 84  *Resp:  [16-18] 16  BP: (89-109)/(48-61) 92/48  SpO2:  [98 %-100 %] 98 %  No intake or output data in the 24 hours ending 08/09/18 0658    Physical Exam  General: NAD  Respiratory: No increased work of breathing.  Cardiovascular:  Normal peripheral perfusion  Abdomen: Soft. Gravid  Extremities: Symmetric lower extremities with no edema    Cervix:  *Dilation: 4  *Effacement (cm): 1.5  *Station: -3    Membrane Status:    intact    Fetal Heart Rate Tracing:  Baseline rate: 125 bpm  Variability: Moderate  FHR Changes: Accelerations    Tocometer:   Uterine Activity  $ Mode: Toco;Removed;Palpation  Contraction Frequency: x3  Contraction Duration: 60-90  Contraction Quality: Mild  Resting Tone Palpated: Soft  Pushing: No    Fetus Presentation:  A cephalic, B breech on 1/28      NST Interpretation: reactive    Data  Labs Reviewed and Significant for:  CBC  No results found in last 36 hours    TRANSTHORACIC ECHOCARDIOGRAM    Conclusions:  Image quality was good. The patient's blood pressure was 108 mmHg/59 mmHg during the study. The heart rate during the study was 83 bpm. Color Flow Doppler was utilized for this exam. Spectral Doppler was utilized for this exam.  1. The left ventricular volume is normal. LV function is normal. LV ejection fraction is estimated to be 55 to 60%. There is no left ventricular hypertrophy. No segmental wall motion abnormalities present.  2. The right ventricular volume is normal. Right ventricular function is normal.  3. There is borderline left atrial enlargement. Right atrial size is normal.  4. There is no hemodynamically significant valvular disease.  5. There is no Doppler evidence of LV  diastolic dysfunction.  6. The pulmonary artery systolic pressure is at least 21 mmHg based on a right atrial pressure of 3 mmHg. PASP is normal (<30 or <35).  7. No pericardial effusion noted. The inferior vena cava is less than 21 mm in diameter and collapses with inspiration consistent with a right atrial pressure of 3 mmHg.  8. Aortic root dimension is normal.    Assessment/Plan:  Pt is a 23 y.o. G2P1001 at [redacted]w[redacted]d admitted with di/di twins in s/o c/f PTL.    #Preterm labor: SVE 2-3/50/-3 at outside hospital. On admission at 0115 4/1.5/-3, stable at 0515 and patient contracting every 10 minutes, able to sleep. Started on Magnesium and discontinued after determined to be stable. UA notable for ketones, thus dehydration may be contributing to contractions.   --PCN discontinued, pt is GBS negative but expires on 08/10/18; repeat would likely be invalid at this time given recent antibiotics  1/30: Repeat SVE: unchanged since admission.   [ ]  UCx, GC/CT pending  [ ]  Regular diet    #Atypical chest pain: left and central prompted by contractions, with left chest wall tenderness. Slightly pleuritic. No SOB. Normal EKG, vitals, O2 sat and tenderness on exam make cardiac or pulmonary etiology unlikely. Given her symptoms,however, the decision was made to obtain TTE. BNP 198. TTE was normal, see above report. Symptoms resolved after discontinuation of MgSO4.  Overall, likely etiology is MSK vs. Chest discomfort 2/2 Magnesium sulfate  - No symptoms today at bedside.   [ ]  CTM sx    #Anemia, chronic  - H/H: 8.6/26.9   - Order placed for Iron studies, plan to follow up   - IV iron ordered today    #FWB/di-di twins w/ IUGR twin A:   A 1579g B 1814g (13% discordant). BMZ 12/14-12/15, 12/27-28   1/30 Ultrasound findings with new IUGR of twin A.   -Discussed ultrasound findings with patient. Patient will need antenatal testing twice weekly, dopplers once weekly and growth ultrasound q 2 weeks. Orders placed. Will schedule with  clinic today.   -Patient will be discharged home, likely in the morning, to [facility]. She is able to come in for antenatal testing from [facility]. Social work provided assistance with housing close by however, patient declined at this time.        This note was prepared with assistance from Evlyn Courier, MS4  The patient was seen and evaluated with Dr. Eugenie Birks (MFM Fellow) and Dr. Truett Mainland (MFM Attending)  Kristin Dublin, MD PGY-3  Department of OBGYN & RS  [facility] [place] [id number]  08/09/2018     [facility]  [facility]    INPATIENT ATTENDING ATTESTATION     Attestation  - My date of service is 08/09/18  - I was present for and performed key portions of an examination of the patient.  - I am personally involved in the management of the patient.    I agree with the findings and care plans as documented.    Additional Attending Services (Beyond Usual Care):  None    Signing Provider    Cassandria Anger, MD  08/09/18    I have personally read and interpreted the NST. The indication for testing is di/di twins, preterm labor.     AAA   The baseline is 120s the variability is Moderate, and the following periodic changes were noted: Accelerations. Overall, the NST is reactive.    BBB   The baseline is 130s the variability is Moderate, and the following periodic changes were noted: Accelerations. Overall, the NST is reactive.    Cassandria Anger, MD

## 2018-08-09 NOTE — Discharge Instructions (Signed)
You were hospitalized for preterm labor. We are discharging you because the preterm labor stopped and the fetal heart monitoring looks reassuring.     One of your twins has been growing very slowly. This is called growth restriction.     As a result of your preterm labor and growth restriction, we would like for you to return to [place] twice per week for the remainder of the pregnancy for monitoring and ultrasounds.     You should receive:   1. Antenatal Testing every Monday and Thursday, starting 2/3  2. Doppler ultrasounds every Thursday, starting 2/6  3. Growth ultrasounds every other Thursday, starting 2/13    We were not able to schedule your Dopplers and growth ultrasounds before they closed for the weekend. Please call the [facility] at your earliest convenience on Monday to schedule this appointment at [phone]. Or you can make the appointment in person when you come for antenatal testing on Monday 2/3.     You received iron during this hospitalization to treat your anemia. Please see the attached information on high-iron foods you can eat to improve your anemia.

## 2018-08-09 NOTE — Procedures (Signed)
[facility] [place]  Department of OB/GYN & Reproductive Sciences  [facility] Center   [address] Floor  [place], [place]  [address]  [phone], [phone]  August 09, 2018    [facility] Provider          Ultrasound Report     Re: Kristin Lee (DOB: 1995/11/29)  MRN: [facility] MRN:  [id number]     INDICATION: Twins Di-Di   TECHNIQUE: transabdominal     Dear  Provider:    [name], who is a 23 year old female, grav 2, para 1, was seen in our office  on August 08, 2018 for ultrasound  due to: Twins Di-Di .     A dichorionic/diamniotic living twin gestation is identified.  Twin A is  inferior/left, vertex, and the placenta is posterior. The placental cord  insertion was not visualized.  Twin B is superior/right, breech, and the  placenta is posterior.  The placental cord insertion was not visualized.  The  amniotic fluid volume for both twins is normal.  The DVP for twin A is 3.9 cm.   The DVP for twin B is 4.0 cm.   The uterus was unremarkable. The placenta has  no evidence of previa. Both ovaries were visualized and were normal in  appearance.      The following measurements were obtained:    Twin A:    BPD    77 mm,    31 weeks    0 days                                                                                                                                                                                                                                                    HC    291 mm,    32 weeks    0 days      AC    245 mm,    28 weeks    5 days      FL    56 mm,    29 weeks    2 days    Gestational age based on biometry is  30 weeks 2 days with an EDD  of 10/15/2018.   The EFW is 1384 grams.  This is in the 3.8 %ile.  Twin B:    BPD    81 mm,    32 weeks    5 days      HC    301 mm,    33 weeks    3 days      AC    261 mm,    30 weeks    1 days      FL    58 mm,    30 weeks    4 days    Gestational age based on biometry is  31 weeks 5 days with an EDD   of  10/05/2018. The EFW of fetus B is 1642 grams.  This is in the 24.4 %ile.      Gestational age based on patient's reported EDC of 10/08/2018 is 31 weeks 2  days.    Umbilical artery Dopplers were performed, and were normal, with an S/D of 3.13.   Umbilical artery Dopplers were performed on fetus B, and were normal, with an  S/D of 3.78.      This examination included a  survey of the fetal anatomy. The survey is  comprised of (but not limited to) the following views where technically  possible and clinically appropriate:   Brain  parenchyma, cerebral ventricles (including 4th ventricle), cerebellum,  choroid plexus, cisterna magna, midline falx, cavum septi pellucidi, and nuchal  thickness  Spine - cervical, thoracic, lumbar and sacral spine, shape and curvature  Face - orbits including lens, fetal upper lip, maxilla, mandible, nose, nasal  bone, profile, and neck  Heart  situs, four-chamber image of the heart, great vessels (and/or outflow  tracts), 3 vessel view, superior and inferior vena cava and aortic arch.  Abdomen - diaphragm, kidneys, gallbladder, liver, stomach (left-sided),  kidneys, urinary bladder, ventral abdominal wall, umbilical cord insertion into  the fetal abdomen, and umbilical cord vessel number.  Extremities - legs, arms, hands and feet.      In twin A no major structural abnormalities were identified, although  visualization was limited by fetal position.  In twin B no major structural  abnormalities were identified, although visualization was limited by fetal  position.      Impressions:  This is a dichorionic, diamniotic twin gestation.  Both fetuses  are appropriately grown for gestational age, with normal amniotic fluid  pockets.       Twin A is measuring at the 4th%ile. The [facility] is at the 2nd percentile. The DVP  is normal. UA dopplers are normal. Twin B is measuring at the 24th percentile  with normal DVP.  There is a 16 percent size discordance between the fetuses.  Recommend UA  dopplers in one week and follow up growth in 2 weeks.         Sincerely yours,      Arley Phenix, M.D.  Perinatal Medicine & Genetics      Electronic Signature: Arley Phenix, M.D.  Signed 08/09/2018 at 9:24 AM

## 2018-08-09 NOTE — Progress Notes (Signed)
August 09, 2018    [facility] Provider          Ultrasound Report     Re: Kristin Lee (DOB: 07-26-1995)  MRN: [facility] MRN:  [id number]     INDICATION: Twins Di-Di   TECHNIQUE: transabdominal     Dear  Provider:    [name], who is a 23 year old female, grav 2, para 1, was seen in our office on August 08, 2018 for ultrasound  due to: Twins Di-Di .     A dichorionic/diamniotic living twin gestation is identified.  Twin A is inferior/left, vertex, and the placenta is posterior. The placental cord insertion was not visualized.  Twin B is superior/right, breech, and the placenta is posterior.  The placental cord insertion was not visualized.  The amniotic fluid volume for both twins is normal.  The DVP for twin A is 3.9 cm.  The DVP for twin B is 4.0 cm.   The uterus was unremarkable. The placenta has no evidence of previa. Both ovaries were visualized and were normal in appearance.      The following measurements were obtained:    Twin A: BPD 77 mm, 31 weeks 0 days                                                                                                                                                                                                                                              HC 291 mm, 32 weeks 0 days   AC 245 mm, 28 weeks 5 days   FL 56 mm, 29 weeks 2 days    Gestational age based on biometry is  30 weeks 2 days with an EDD  of 10/15/2018.  The EFW is 1384 grams.  This is in the 3.8 %ile.       Twin B: BPD 81 mm, 32 weeks 5 days   HC 301 mm, 33 weeks 3 days   AC 261 mm, 30 weeks 1 days   FL 58 mm, 30 weeks 4 days    Gestational age based on biometry is  31 weeks 5 days with an EDD  of 10/05/2018. The EFW of fetus B is 1642 grams.  This is in the 24.4 %ile.      Gestational age based on patient's reported EDC of 10/08/2018 is 31 weeks 2 days.  Umbilical artery Dopplers were performed, and were normal, with an S/D of 3.13.  Umbilical artery Dopplers were performed on fetus B, and were normal,  with an S/D of 3.78.      This examination included a  survey of the fetal anatomy. The survey is comprised of (but not limited to) the following views where technically possible and clinically appropriate:   Brain  parenchyma, cerebral ventricles (including 4th ventricle), cerebellum, choroid plexus, cisterna magna, midline falx, cavum septi pellucidi, and nuchal thickness  Spine - cervical, thoracic, lumbar and sacral spine, shape and curvature  Face - orbits including lens, fetal upper lip, maxilla, mandible, nose, nasal bone, profile, and neck  Heart  situs, four-chamber image of the heart, great vessels (and/or outflow tracts), 3 vessel view, superior and inferior vena cava and aortic arch.  Abdomen - diaphragm, kidneys, gallbladder, liver, stomach (left-sided), kidneys, urinary bladder, ventral abdominal wall, umbilical cord insertion into the fetal abdomen, and umbilical cord vessel number.  Extremities - legs, arms, hands and feet.      In twin A no major structural abnormalities were identified, although visualization was limited by fetal position.  In twin B no major structural abnormalities were identified, although visualization was limited by fetal position.      Impressions:  This is a dichorionic, diamniotic twin gestation.  Both fetuses are appropriately grown for gestational age, with normal amniotic fluid pockets.       Twin A is measuring at the 4th%ile. [facility] is at the 2nd percentile. The DVP is normal. UA dopplers are normal. Twin B is measuring at the 24th percentile with normal DVP.  There is a 16 percent size discordance between the fetuses. Recommend UA dopplers in one week and follow up growth in 2 weeks.         Sincerely yours,      Arley Phenix, M.D.  Perinatal Medicine & Genetics

## 2018-08-10 LAB — URINE CULTURE: Bacterial Culture, Urine w/o g: 1000 — AB

## 2018-08-10 LAB — FERRITIN: Ferritin: 9 ug/L — ABNORMAL LOW (ref 12–114)

## 2018-08-10 MED ORDER — FLU VACCINE QS 2019-20(6MOS UP)(PF) 60 MCG(15 MCGX4)/0.5 ML IM SYRINGE
60 mcg (15 mcg x 4)/0.5 mL | INTRAMUSCULAR | Status: AC
  Administered 2018-08-10: 20:00:00 via INTRAMUSCULAR

## 2018-08-10 MED FILL — FAMOTIDINE 20 MG TABLET: 20 mg | ORAL | Qty: 1

## 2018-08-10 MED FILL — FLULAVAL QUAD 2019-2020 (PF) 60 MCG (15 MCG X 4)/0.5 ML IM SYRINGE: 60 mcg (15 mcg x 4)/0.5 mL | INTRAMUSCULAR | Qty: 1

## 2018-08-10 MED FILL — STOOL SOFTENER 250 MG CAPSULE: 250 mg | ORAL | Qty: 1

## 2018-08-10 MED FILL — VINATE ONE 60 MG IRON-1 MG TABLET: 60 mg iron-1 mg | ORAL | Qty: 1

## 2018-08-10 MED FILL — FERROUS SULFATE 325 MG (65 MG IRON) TABLET: 325 mg (65 mg elemental) | ORAL | Qty: 1

## 2018-08-11 LAB — STREPTOCOCCUS GROUP B CULTURE

## 2018-08-12 LAB — IRON, % SATURATION AND TRANSFE
% Saturation: 37 % (ref 10–47)
Iron, serum: 203 ug/dL — ABNORMAL HIGH (ref 29–189)
Transferrin: 397 mg/dL — ABNORMAL HIGH (ref 182–360)

## 2018-08-15 DIAGNOSIS — Z3A34 34 weeks gestation of pregnancy: Secondary | ICD-10-CM

## 2018-08-15 DIAGNOSIS — O30049 Twin pregnancy, dichorionic/diamniotic, unspecified trimester: Secondary | ICD-10-CM

## 2018-08-15 DIAGNOSIS — O365991 Maternal care for other known or suspected poor fetal growth, unspecified trimester, fetus 1: Secondary | ICD-10-CM

## 2018-08-15 DIAGNOSIS — O30043 Twin pregnancy, dichorionic/diamniotic, third trimester: Secondary | ICD-10-CM

## 2018-08-15 DIAGNOSIS — Z3A32 32 weeks gestation of pregnancy: Secondary | ICD-10-CM

## 2018-08-15 DIAGNOSIS — O365930 Maternal care for other known or suspected poor fetal growth, third trimester, not applicable or unspecified: Secondary | ICD-10-CM

## 2018-08-15 LAB — NST/AFI SINGLE OR FIRST GEST.(
FHR Baseline: 140
NST Interpretation: REACTIVE

## 2018-08-15 LAB — NST/AFI EACH ADDL'L GESTATION
FHR Baseline: 135
NST Interpretation: REACTIVE

## 2018-08-15 NOTE — Progress Notes (Signed)
Reproductive Genetics Progress Notes, Consult Letters and Interpretations are filed in Chart Review under the "Notes" tab. Reproductive Genetics Ultrasounds are filed under the "Imaging" tab.

## 2018-08-15 NOTE — Progress Notes (Signed)
Patient seen in [facility].

## 2018-08-16 ENCOUNTER — Ambulatory Visit: Admit: 2018-08-16 | Discharge: 2018-08-16 | Payer: PRIVATE HEALTH INSURANCE

## 2018-08-19 NOTE — Procedures (Signed)
[facility] [place] [place]  Department of OB/GYN & Reproductive Sciences  Prenatal Diagnostic Center   [address] Floor  [place], [place]  [address]  [phone], [phone]  August 19, 2018    Burna Forts, M.D.  [facility]  [place] [phone]        Ultrasound Report     Re: Kristin Lee (DOB: 18-May-1996)  MRN: [facility] MRN:  [id number]     INDICATION: dichorionic- diamniotic twins and doppler  TECHNIQUE: transabdominal     Dear Dr. Angelica Pou:    [name], who is a 23 year old female, grav 2, para 1, was seen in our office  on August 15, 2018 for ultrasound  due to: dichorionic- diamniotic twins and  doppler.     A dichorionic/diamniotic living twin gestation is identified.  Twin A (female)  is left/inferior, vertex, and the placenta is posterior. The placental cord  insertion is normal.  Twin B (female) is right/superior, breech, and the placenta  is posterior.  The placental cord insertion is normal.  The amniotic fluid  volume for both twins is normal.  The DVP for twin A is 4.9 cm.  The DVP for  twin B is 4.6 cm.   The uterus was unremarkable. The placenta has no evidence  of previa. Both ovaries were visualized and were normal in appearance.      Gestational age based on patient's reported EDC of 10/08/2018 is 32 weeks 2  days.    Marland Kitchen    Umbilical artery Dopplers were performed on fetus A, and were normal, with an  S/D of 2.5-3.01.  Umbilical artery Dopplers were performed on fetus B, and were  normal, with an S/D of 2.4-3.1.      Impressions:  Today was a targeted assessment of fluid and dopplers only. Both  twins have normal fluid and normal dopplers. I recommend the following:    1. Please start twice weekly nonstress tests and continue until delivery.  2. We will do weekly scans for dopplers with every other week scans for growth.      The patient is scheduled for a return visit on Thursday, August 29, 2018 at  12:45 PM.      Sincerely yours,      Doralee Albino. Abbe Amsterdam,  M.D.  Perinatal Medicine & Genetics      Electronic Signature: Doralee Albino. Abbe Amsterdam, M.D.  Signed 08/19/2018 at 10:11 AM

## 2018-08-28 DIAGNOSIS — O30043 Twin pregnancy, dichorionic/diamniotic, third trimester: Secondary | ICD-10-CM

## 2018-08-28 DIAGNOSIS — O30049 Twin pregnancy, dichorionic/diamniotic, unspecified trimester: Secondary | ICD-10-CM

## 2018-08-28 DIAGNOSIS — Z3A34 34 weeks gestation of pregnancy: Secondary | ICD-10-CM

## 2018-08-28 DIAGNOSIS — O26843 Uterine size-date discrepancy, third trimester: Secondary | ICD-10-CM

## 2018-08-28 DIAGNOSIS — Z23 Encounter for immunization: Secondary | ICD-10-CM

## 2018-08-28 MED ORDER — PRENATAL VIT 10-IRON-FOLIC-DHA ORAL
ORAL | Status: AC
Start: 2018-08-28 — End: ?

## 2018-08-28 MED ORDER — DIPHTH,PERTUSSIS(ACEL),TETANUS 2.5 LF UNIT-8 MCG-5 LF/0.5ML IM SYRINGE
Freq: Once | INTRAMUSCULAR | Status: AC
Start: 2018-08-28 — End: 2018-08-28
  Administered 2018-08-28: 20:00:00 via INTRAMUSCULAR

## 2018-08-28 NOTE — Progress Notes (Signed)
Reproductive Genetics Progress Notes, Consult Letters and Interpretations are filed in Chart Review under the "Notes" tab. Reproductive Genetics Ultrasounds are filed under the "Imaging" tab.             Reproductive Genetics Progress Notes, Consult Letters and Interpretations are filed in Chart Review under the "Notes" tab. Reproductive Genetics Ultrasounds are filed under the "Imaging" tab.

## 2018-08-28 NOTE — Progress Notes (Signed)
MFM Clinic Note  Kristin Lee is a 23 y.o. G2P1001 at 3242w1d dichorionic diamniotic twin gestation, dated by LMP consistent with 11 week US (Estimated Date of Delivery: 10/08/18).  She presents today for consultation at Clinic with Maternal Fetal Medicine to establish prenatal care.  She is accompanied by her mother and her son.  This is a spontaneous pregnancy.    Of note, on 08/07/18 the patient was transferred to [facility] for preterm labor at 6536w1d from OSH.  At 3 am on 08/06/18 she began to experience contractions, mostly felt in her back. She had one small spot of bleeding with wiping at 1100 on 08/06/18. She denied any leakage of fluid. She reported chest pain when with contraction, radiating from her belly to her neck, not associated with SOB although she felt generally more short of breath as the pregnancy progresses in the form of a difficulty taking a deep breath.  At the outside hospital, she was placed on Magnesium and penicillin.  Her exam was 2-3/50/-2 and she was [facility] higher level of care. Her most recent ultrasound on 08/06/18 showed Twin A was 1579g (18%), Twin B was 1814g (56.0%) - 13% discordant.     The patient has been previously admitted for c/f PTL in the past and has received two courses of betamethasone.  At the OSH, she was 2-3/50/-2 and upon arrival to [facility], was then noted to be 4/1.5/-3. On repeat SVE, there was no change, so she was taken off magnesium.      In regards to her chest pain, the pain was reproducible upon palpation to the chest wall. ECG within normal limits. O2 sats within normal limits. Given reports of SOB and dizziness with ambulation, BNP and TTE were performed and within normal limits. The symptoms abated when magnesium was discontinued. The atypical chest pain was thought to be musculoskeletal or due to a medication side effect. Return precautions were discussed with the patient.     She was discharged in good condition on 08/10/18 with plan to  transfer prenatal care here for the remainder of this pregnancy.      Today, she denies vaginal bleeding, contractions or leakage of fluid.  She endorses fetal movement.     Past Medical History:    No significant past medical history.    Past Surgical History:    Surgical History     Procedure Laterality Date Comment Source    KNEE SURGERY Right 2014 ACL and meniscus replacement Provider          Obstetrical History:    OB History   Gravida Para Term Preterm AB Living   2 1 1     1    SAB TAB Ectopic Molar Multiple Live Births             1      # Outcome Date GA Lbr Len/2nd Weight Sex Delivery Anes PTL Lv   2 Current            1 Term 2017 3330w0d  3.402 kg (7 lb 8 oz) M Vag-Forceps   LIV   Delivered in [place], denies preeclampsia and blood transfusion but notes that she "lost a lot of blood"    Gynecologic History:    The patient denies any history of abnormal Pap smears and sexually transmitted diseases.    Prenatal Course:    The patient has had several episodes c/f preterm labor, and presents today to transfer prenatal care here  to [place].      Allergies:  No known drug allergies.    Medications:      Current Outpatient Medications:     PRENATAL VIT 10-IRON-FOLIC-DHA ORAL, Daily for See Label Comment, Disp: , Rfl:   No current facility-administered medications for this visit.     Social History:    The patient denies tobacco, alcohol or illicit drug use.  She and her son live with her mother in [place].  She reports that the father of the baby is involved, but is currently out working in [place], will return in 2 weeks.    Family History:    There is no family history of congenital anomalies, genetic disorders, stillbirth or recurrent miscarriage.    Objective:  BP 99/66   Pulse 103   Wt 63.5 kg (140 lb)   LMP 01/01/2018 (Exact Date)   BMI 24.11 kg/m     General: NAD  Respiratory: No respiratory distress  Abdomen: Gravid    Data/Studies:  I have personally reviewed and interpreted the  available pertinent data and studies.     08/15/18 US:  A dichorionic/diamniotic living twin gestation is identified.  Twin A (female) is left/inferior, vertex, and the placenta is posterior. The placental cord insertion is normal.  Twin B (female) is right/superior, breech, and the placenta is posterior.  The placental cord insertion is normal.  The amniotic fluid volume for both twins is normal.  The DVP for twin A is 4.9 cm.  The DVP for twin B is 4.6 cm.   The uterus was unremarkable. The placenta has no evidence of previa. Both ovaries were visualized and were normal in appearance.  Umbilical artery Dopplers were performed on fetus A, and were normal, with an S/D of 2.5-3.01.  Umbilical artery Dopplers were performed on fetus B, and were normal, with an S/D of 2.4-3.1.      08/08/18 GBS:  Negative    08/08/18 Urine Culture:  Positive. 1,000 urogenital flora.    08/07/18 Chlamydia Trachomatic/Neisseria Gonorrhoeae:  Not detected x2    08/07/18 MRSA Culture:  Negative.    08/07/18 Maternal Echo:  Image quality was good. The patient's blood pressure was 108 mmHg/59 mmHg during the study. The heart rate during the study was 83 bpm. Color Flow Doppler was utilized for this exam. Spectral Doppler was utilized for this exam.  1. The left ventricular volume is normal. LV function is normal. LV ejection fraction is estimated to be 55 to 60%. There is no left ventricular hypertrophy. No segmental wall motion abnormalities present.  2. The right ventricular volume is normal. Right ventricular function is normal.  3. There is borderline left atrial enlargement. Right atrial size is normal.  4. There is no hemodynamically significant valvular disease.  5. There is no Doppler evidence of LV diastolic dysfunction.  6. The pulmonary artery systolic pressure is at least 21 mmHg based on a right atrial pressure of 3 mmHg. PASP is normal (<30 or <35).  7. No pericardial effusion noted. The inferior vena cava is less than 21 mm in diameter  and collapses with inspiration consistent with a right atrial pressure of 3 mmHg.  8. Aortic root dimension is normal.  Previous Comparison:  No previous study is available for comparison.    08/07/18 Maternal ECG:  Normal ECG  Normal sinus rhythm  Early precordial QRS transition noteworthy  No previous ECGs available  Confirmed by FUNG, GORDON ([id number]) on 08/07/2018 1:29:02 PM  Component      Latest Ref Rng & Units 08/07/2018   Glucose, (UA)      NEG mg/dL NEG   Bilirubin, Urine      NEG NEG   Ketones, UA      NEG mg/dL 20 (A)   Specific Gravity      1.002 - 1.030 1.003   Hemoglobin (UA)      NEG Moderate (A)   pH, UA      4.5 - 8.0 7.0   Protein, UA      NEG mg/dL NEG   Nitrite      NEG NEG   WBC Esterase      NEG NEG   Urobilinogen      NEG mg/dL(EU/dL) NEG   WBCs, UR      <5 /HPF <5   RBCs, urine      <3 /HPF <3   WBC Count      3.4 - 10 x10E9/L 9.2   RBC Count      4.0 - 5.2 x10E12/L 3.21 (L)   HEMOGLOBIN (HGB)      12.0 - 15.5 g/dL 8.6 (L)   Hematocrit      36 - 46 % 26.9 (L)   MCV      80 - 100 fL 84   MCH      26 - 34 pg 26.8   MCHC      31 - 36 g/dL 01.7   Platelet Count      140 - 450 x10E9/L 245   Type and Screen Expiration       08/10/2018   ABO/RH(D)       O POS   ABO/RH Comment       [place] law requires MD to inform pregnant women of Rh.   Antibody Screen       NEG   ABO/Rh Confirmation Req'd       NO   Treponemal Antibody Only      Nonreactive Nonreactive   B-Type Natriuretic Peptide      <48 pg/mL 198 (H)      Component      Latest Ref Rng & Units 08/09/2018   Iron, serum      29 - 189 ug/dL 510 (H)   Transferrin      182 - 360 mg/dL 258 (H)   % Saturation      10 - 47 % 37   Ferritin      12 - 114 ug/L 9 (L)       Assessment/Recommendations:    # Di-Di twin pregnancy, S<D  - Reviewed signs and symptoms of preterm labor and preeclampsia in detail.  Information placed AVS.  - Patient has growth [place] and NST scheduled for later today.    - Continue weekly [place] with Dopplers and twice weekly NST until delivery.   NST will be performed locally.  - Recommend repeat growth [place] when the patient returns for follow up in 2 weeks given concern for fetal growth trajectory.    #Routine OB  - TDaP administered today  - Patient reports GDM screening was normal.    #Delivery planning  - Given di-di twin gestation with S<D and advanced cervical dilation, recommend IOL at [redacted] weeks ga.  The patient is aware that earlier delivery may be indicated in the setting of nonreassuring NST, abnormal Doppler or the usual maternal-fetal indications.  This is scheduled for 3/10 at 8 pm.  - I discussed criteria for  vaginal birth in twin pregnancies including cephalic presentation in twin A.  Since twin B is the larger twin, I reviewed that C/S may be necessary if the intertwin weight discordance is >25% with B being the larger twin.  Patient is open to the possibility of C/S.  Mode of delivery will be finalized later in this pregnancy following repeat US evaluation.      Return to clinic: 2 weeks    She is aware that if there are any concerns for preterm labor, vaginal bleeding, leaking of fluid or decreased fetal movement, then she needs to be evaluated at the nearest hospital.  If transport to [place] is necessary and can be achieved safely, then this may be arranged through her physician.     I spent a total of 30 minutes face-to-face with the patient and >50% of that time was spent counseling regarding the symptoms, treatment plan, risks and/or therapeutic options for the diagnoses above.      It was our pleasure to assist in the prenatal care of Louretta.  If you have any questions regarding her care or the care of other patients, please do not hesitate to contact us.      Future Appointments   Date Time Provider Department Center   08/28/2018  1:00 PM Antenatal Testing Provider 2 [facility]   08/28/2018  2:00 PM PDC SM 1 PDC SM [facility]     I, Maigrie McDougal am acting as a Neurosurgeon for services provided by Hedy Camara, MD on  08/28/2018 12:02 PM      The above scribed documentation accurately reflects the services I have provided.    Hedy Camara, MD   08/28/2018 3:58 PM

## 2018-08-28 NOTE — Procedures (Signed)
[facility] [place]  Department of OB/GYN & Reproductive Sciences  Prenatal Diagnostic Center   [address] Floor  [place], [place]  [address]  [phone], [phone]  August 28, 2018    Burna Forts, M.D.  [facility]  [place] [phone]        Ultrasound Report     Re: Kristin Lee (DOB: 01-13-1996)  MRN: [facility] MRN:  [id number]     INDICATION: dichorionic- diamniotic twins  TECHNIQUE: transabdominal     Dear Dr. Angelica Pou:    Ms. Nicolay, who is a 23 year old female, grav 2, para 1, was seen in our office  on August 28, 2018 for ultrasound  due to: dichorionic- diamniotic twins.     A dichorionic/diamniotic living twin gestation is identified.  Twin A is left,  vertex, and the placenta is posterior. The placental cord insertion was not  visualized.  Twin B is right, breech, and the placenta is posterior.  The  placental cord insertion was not visualized.  The amniotic fluid volume for  both twins is normal.  The DVP for twin A is 4.2 cm.  The DVP for twin B is 6.4  cm.      The following measurements were obtained:    Twin A:    BPD    86 mm,    34 weeks    5 days                                                                                                                                                                                                                                                    HC    315 mm,    35 weeks    2 days      AC    299 mm,    33 weeks    6 days      FL    65 mm,    33 weeks    4 days    Gestational age based on biometry is  34 weeks 1 days with an EDD  of  10/08/2018.  The EFW is 2220 grams.  This is in the 28 %ile.       Twin B:    BPD    86 mm,  34 weeks    5 days      HC    312 mm,    34 weeks    6 days      AC    298 mm,    33 weeks    6 days      FL    63 mm,    32 weeks    4 days    Gestational age based on biometry is  34 weeks 0 days with an EDD  of 10/09/2018.  The EFW of fetus B is 2238 grams.  This is in the  30 %ile.      Gestational age based on patient's reported EDC of 10/08/2018 is 34 weeks 1  days.    Umbilical artery Dopplers were performed on twin A, and were normal, with an  S/D of 3.01.  Umbilical artery Dopplers were performed on twin B, and were  normal, with an S/D of 3.21.      This examination included a  survey of the fetal anatomy. The survey is  comprised of (but not limited to) the following views where technically  possible and clinically appropriate:   Brain  parenchyma, cerebral ventricles (including 4th ventricle), cerebellum,  choroid plexus, cisterna magna, midline falx, cavum septi pellucidi, and nuchal  thickness  Spine - cervical, thoracic, lumbar and sacral spine, shape and curvature  Face - orbits including lens, fetal upper lip, maxilla, mandible, nose, nasal  bone, profile, and neck  Heart  situs, four-chamber image of the heart, great vessels (and/or outflow  tracts), 3 vessel view, superior and inferior vena cava and aortic arch.  Abdomen - diaphragm, kidneys, gallbladder, liver, stomach (left-sided),  kidneys, urinary bladder, ventral abdominal wall, umbilical cord insertion into  the fetal abdomen, and umbilical cord vessel number.  Extremities - legs, arms, hands and feet.      In twin A no major structural abnormalities were identified, although  visualization was limited by fetal position, late gestational age and multiple  gestation.  In twin B, no major structural abnormalities were identified,  although visualization was limited by fetal position and late gestational age.       Impressions:  This is a dichorionic, diamniotic twin gestation.  Both fetuses  are appropriately grown for gestational age, with normal amniotic fluid  pockets.  There is a 1 percent size discordance between the fetuses.  Recommend  follow up in two weeks.      The patient is scheduled for a return visit on Thursday, September 12, 2018 at 1:30  PM.      Sincerely yours,      Sherlon Handing, M.D.  Perinatal  Medicine & Genetics      Electronic Signature: Sherlon Handing, M.D.  Signed 08/28/2018 at 2:15 PM

## 2018-08-28 NOTE — Patient Instructions (Signed)
Learning About Preeclampsia   What is preeclampsia?  A woman with preeclampsia has blood pressure that is higher than usual. She may also have other serious symptoms.  Preeclampsia can be dangerous. When it is severe, it can cause seizures (eclampsia) or liver or kidney damage. When the liver is affected, some women get HELLP syndrome, a blood-clotting and bleeding problem. HELLP can come on quickly and can be deadly. This is why your doctor checks you and your baby often.  Preeclampsia usually occurs after 20 weeks of pregnancy. Most often, it starts near the end of pregnancy and goes away after childbirth. But symptoms may last a few weeks or more and can get worse after delivery. Rarely, symptoms of preeclampsia don't show up until days or even weeks after childbirth.  What are the symptoms?  Mild preeclampsia usually doesn't cause symptoms. But preeclampsia can cause rapid weight gain and sudden swelling of the hands and face.  Severe preeclampsia does cause symptoms. It can cause a very bad headache and trouble seeing and breathing. It also can cause belly pain. You may also urinate less than usual.  If you have new preeclampsia symptoms after you go home from the hospital, call your doctor right away.  What can you expect after you have had preeclampsia?  In the hospital  After the baby and the placenta are delivered, preeclampsia usually starts to improve. Most women get better in the first few days after childbirth.  After having preeclampsia, you still have a risk of seizures for a day or more after childbirth. (Very rarely, seizures happen later on.) So your doctor may have you take magnesium sulfate for a day or more to prevent seizures. You may also take medicine to lower your blood pressure.  When you go home  Your blood pressure will most likely return to normal a few days after delivery. Your doctor will want to check your blood pressure sometime in the first week after you leave the hospital.  Some  women still have high blood pressure 6 weeks after childbirth. But most return to normal levels over the long term.   Take and record your blood pressure at home if your doctor tells you to.  ? Learn the importance of the two measures of blood pressure (such as 120 over 80, or 120/80). The first number is the systolic pressure. This is the force of blood on the artery walls as the heart pumps. The second number is the diastolic pressure. This is the force of blood on the artery walls between heartbeats, when the heart is at rest. You have a choice of monitors to use.   Manual monitor: You pump up the cuff and use a stethoscope to listen for your pulse.   Electronic monitor: The cuff inflates, and a gauge shows your pulse rate.  ? To take your blood pressure:   Ask your doctor to check your blood pressure monitor to be sure that it is accurate and that the cuff fits you. Also ask your doctor to watch you use it, to make sure that you are using it right.   You should not eat, use tobacco products, or use medicine known to raise blood pressure (such as some nasal decongestant sprays) before you take your blood pressure.   Avoid taking your blood pressure if you have just exercised. Also avoid taking it if you are nervous or upset. Rest at least 15 minutes before you take your blood pressure.   Be safe with   medicines. If you take medicine, take it exactly as prescribed. Call your doctor if you think you are having a problem with your medicine.   Do not smoke. Quitting smoking will help improve your baby's growth and health. If you need help quitting, talk to your doctor about stop-smoking programs and medicines. These can increase your chances of quitting for good.   Eat a balanced and healthy diet that has lots of fruits and vegetables.  Long-term health  After you have had preeclampsia, you have a higher-than-average risk of heart disease, stroke, and kidney disease. This may be because the same things that  cause preeclampsia also cause heart and kidney disease.  To protect your health, work with your doctor on living a heart-healthy lifestyle and getting the checkups you need. Your doctor may also want you to check your blood pressure at home.  Follow-up care is a key part of your treatment and safety. Be sure to make and go to all appointments, and call your doctor if you are having problems. It's also a good idea to know your test results and keep a list of the medicines you take.  Where can you learn more?  Go to https://www.healthwise.net/patientEd  Enter Q718 in the search box to learn more about "Learning About Preeclampsia After Childbirth."  Current as of: Dec 05, 2017  Content Version: 12.3   2006-2019 [employer]. Care instructions adapted under license by your healthcare professional. If you have questions about a medical condition or this instruction, always ask your healthcare professional. Healthwise, Incorporated disclaims any warranty or liability for your use of this information.    Recognizing Premature Labor - [place]  A term pregnancy takes about 40 weeks to complete. Babies born before 37 weeks may have problems breathing, eating and keeping warm. Premature labor occurs after the 20th week but before the 37th week of pregnancy. It is a condition in which uterine contractions (tightening of the womb) cause the cervix (mouth of the womb) to open earlier than normal. It could result in the birth of a premature baby.   Certain factors may increase a woman's chances of having premature labor, such as carrying twins. Often, the causes of premature labor are unknown. Sometimes a woman can have premature labor for no apparent reason.   It may be possible to delay a premature birth by knowing the warning signs of premature labor and by seeking care early.     Warning Signs and Symptoms   Uterine contractions that occur six or more times in an hour, with or without any other warning  sign   Menstrual-like cramps felt in the lower abdomen that come and go or are constant   Low dull backache felt below the waistline that may come and go or be constant   Pelvic pressure that comes and goes and that feels like your baby is pushing down   Abdominal cramping with or without diarrhea   Increase or change in vaginal discharge such as change into a mucousy, watery or bloody discharge     Uterine Contractions   It is normal to have some uterine contractions throughout the day. They often occur when you change positions, such as from sitting to lying down.   It is not normal to have frequent uterine contractions (six or more in one hour). Frequent uterine contractions may cause your cervix to begin to open.     Self-Detection of Uterine Contractions  Since the onset of premature labor is very subtle   and often hard to recognize, it is important to  know how to feel your abdomen for uterine contractions. You can feel for contractions by placing your fingertips on the top of your uterus while lying down.    A contraction is a periodic tightening or hardening of your uterus. If your uterus is contracting, you will actually feel your abdomen get tight or hard, and then feel it relax or soften when the contraction is over.     What You Should Do  If you think you are having uterine contractions or any other signs and symptoms of premature labor:    Lie down tilted towards your side. Support your back with a pillow.   Sometimes lying down for an hour may slow down or stop the signs and symptoms.   Do not lie flat on your back because lying flat may cause the contractions to occur more often.   Do not turn completely on your side because you may not be able to feel the contractions.   Hydrate yourself. Drink several large glasses of water. Sometimes being dehydrated can cause contractions.    Check for contractions for one hour.   To tell how often contractions are occurring, check the minutes that  elapse from the start of one of your contractions  Reviewed by [facility]  Last updated May 2011.

## 2018-08-28 NOTE — Patient Instructions (Signed)
For more information on caring for yourself during pregnancy, please visit our electronic Women's Health Pregnancy Guidebook at [email or web address] .     Your ultrasound report will be available on MyChart within 48 hours

## 2018-08-29 ENCOUNTER — Ambulatory Visit: Admit: 2018-08-29 | Discharge: 2018-08-29 | Payer: PRIVATE HEALTH INSURANCE | Attending: Clinical Genetics (M.D.)

## 2018-08-29 ENCOUNTER — Ambulatory Visit: Admit: 2018-08-29 | Discharge: 2018-08-29 | Payer: PRIVATE HEALTH INSURANCE | Attending: Maternal & Fetal Medicine

## 2018-09-04 NOTE — Telephone Encounter (Addendum)
22yo G2P1 at [redacted]w[redacted]d with twins.   Pt calling with c/f fetal measurement at OSH.    [place] sono  1/30: Twin A at 3.8%ile, Twin B 24.4 %ile  2/19: Twin A 28%ile, Twin B 30%ile    She had repeat sono at her local primary OB today.  States twin A was measuring 4 lbs 7 oz (2012 g) equivalent to 33 weeks. Less than 10%ile.   Twin B was 5 lbs 15 oz (2693 g) in 50%ile.     Pt is concerned that the measurements are off from what she was told at [facility]. Thinks at the time of her last sono at [facility] (2/19) the technician was measuring twin A bigger than what the actual size was.   Pt has repeat sono scheduled on 3/6 with [facility].  Getting twice/wk ATC at OSH.  Scheduled for IOL on 3/10.  She was instructed to contact [facility] regarding the measurements today as she may not be able to deliver vaginally.   Pt would like to discuss her delivery plan.    RN will discuss with HR team. Report given to Dewaine Conger, RN.

## 2018-09-04 NOTE — Telephone Encounter (Signed)
CALL REASON: PT 35W, CONCERNED ABOUT BABY MEASUREMENTS. DISCREPANCIES BETWEEN ULTRASOUNDS AT [facility] AND PRIMARY OB     BEST # TO CALLBACK: [phone] (home)     OKAY TO LEAVE A DETAILED MESSAGE? Yes

## 2018-09-05 NOTE — Telephone Encounter (Signed)
Appointment confirmed.

## 2018-09-05 NOTE — Telephone Encounter (Signed)
Spoke to [place] at [facility] in [place].  Informed her of the plan to see pt at [facility] tomorrow and do dopplers and ATC along with MFM consult.  Requested they increase the frequency of NSTs to 3 x/week. [place] confirmed they will increase the NSTs.  Confirmed that patient will be admitted here on 3/10 for delivery.

## 2018-09-06 DIAGNOSIS — O30043 Twin pregnancy, dichorionic/diamniotic, third trimester: Secondary | ICD-10-CM

## 2018-09-06 DIAGNOSIS — Z3A35 35 weeks gestation of pregnancy: Secondary | ICD-10-CM

## 2018-09-06 DIAGNOSIS — O365931 Maternal care for other known or suspected poor fetal growth, third trimester, fetus 1: Secondary | ICD-10-CM

## 2018-09-06 DIAGNOSIS — O26843 Uterine size-date discrepancy, third trimester: Secondary | ICD-10-CM

## 2018-09-06 DIAGNOSIS — O365990 Maternal care for other known or suspected poor fetal growth, unspecified trimester, not applicable or unspecified: Secondary | ICD-10-CM

## 2018-09-06 MED ORDER — FERROUS SULFATE 325 MG (65 MG IRON) TABLET
325 | ORAL_TABLET | Freq: Every day | ORAL | 1 refills | Status: AC
Start: 2018-09-06 — End: ?

## 2018-09-06 NOTE — Progress Notes (Signed)
Reproductive Genetics Progress Notes, Consult Letters and Interpretations are filed in Chart Review under the "Notes" tab. Reproductive Genetics Ultrasounds are filed under the "Imaging" tab.

## 2018-09-06 NOTE — Patient Instructions (Addendum)
[facility]: [phone]    Recognizing Premature Labor - [place]  A term pregnancy takes about 40 weeks to complete. Babies born before 37 weeks may have problems breathing, eating and keeping warm. Premature labor occurs after the 20th week but before the 37th week of pregnancy. It is a condition in which uterine contractions (tightening of the womb) cause the cervix (mouth of the womb) to open earlier than normal. It could result in the birth of a premature baby.   Certain factors may increase a woman's chances of having premature labor, such as carrying twins. Often, the causes of premature labor are unknown. Sometimes a woman can have premature labor for no apparent reason.   It may be possible to delay a premature birth by knowing the warning signs of premature labor and by seeking care early.     Warning Signs and Symptoms   Uterine contractions that occur six or more times in an hour, with or without any other warning sign   Menstrual-like cramps felt in the lower abdomen that come and go or are constant   Low dull backache felt below the waistline that may come and go or be constant   Pelvic pressure that comes and goes and that feels like your baby is pushing down   Abdominal cramping with or without diarrhea   Increase or change in vaginal discharge such as change into a mucousy, watery or bloody discharge     Uterine Contractions   It is normal to have some uterine contractions throughout the day. They often occur when you change positions, such as from sitting to lying down.   It is not normal to have frequent uterine contractions (six or more in one hour). Frequent uterine contractions may cause your cervix to begin to open.     Self-Detection of Uterine Contractions  Since the onset of premature labor is very subtle and often hard to recognize, it is important to  know how to feel your abdomen for uterine contractions. You can feel for contractions by placing your fingertips on the  top of your uterus while lying down.    A contraction is a periodic tightening or hardening of your uterus. If your uterus is contracting, you will actually feel your abdomen get tight or hard, and then feel it relax or soften when the contraction is over.     What You Should Do  If you think you are having uterine contractions or any other signs and symptoms of premature labor:    Lie down tilted towards your side. Support your back with a pillow.   Sometimes lying down for an hour may slow down or stop the signs and symptoms.   Do not lie flat on your back because lying flat may cause the contractions to occur more often.   Do not turn completely on your side because you may not be able to feel the contractions.   Hydrate yourself. Drink several large glasses of water. Sometimes being dehydrated can cause contractions.    Check for contractions for one hour.   To tell how often contractions are occurring, check the minutes that elapse from the start of one of your contractions  Reviewed by health care specialists at [facility].  Last updated May 2011.   Learning About Preeclampsia  What is preeclampsia?  A woman with preeclampsia has blood pressure that is higher than usual. She may also have other serious symptoms.  Preeclampsia can be dangerous. When it  is severe, it can cause seizures (eclampsia) or liver or kidney damage. When the liver is affected, some women get HELLP syndrome, a blood-clotting and bleeding problem. HELLP can come on quickly and can be deadly. This is why your doctor checks you and your baby often.  Preeclampsia usually occurs after 20 weeks of pregnancy. Most often, it starts near the end of pregnancy and goes away after childbirth. But symptoms may last a few weeks or more and can get worse after delivery. Rarely, symptoms of preeclampsia don't show up until days or even weeks after childbirth.  What are the symptoms?  Mild preeclampsia usually doesn't cause symptoms. But  preeclampsia can cause rapid weight gain and sudden swelling of the hands and face.  Severe preeclampsia does cause symptoms. It can cause a very bad headache and trouble seeing and breathing. It also can cause belly pain. You may also urinate less than usual.  If you have new preeclampsia symptoms after you go home from the hospital, call your doctor right away.  What can you expect after you have had preeclampsia?  In the hospital  After the baby and the placenta are delivered, preeclampsia usually starts to improve. Most women get better in the first few days after childbirth.  After having preeclampsia, you still have a risk of seizures for a day or more after childbirth. (Very rarely, seizures happen later on.) So your doctor may have you take magnesium sulfate for a day or more to prevent seizures. You may also take medicine to lower your blood pressure.  When you go home  Your blood pressure will most likely return to normal a few days after delivery. Your doctor will want to check your blood pressure sometime in the first week after you leave the hospital.  Some women still have high blood pressure 6 weeks after childbirth. But most return to normal levels over the long term.   Take and record your blood pressure at home if your doctor tells you to.  ? Learn the importance of the two measures of blood pressure (such as 120 over 80, or 120/80). The first number is the systolic pressure. This is the force of blood on the artery walls as the heart pumps. The second number is the diastolic pressure. This is the force of blood on the artery walls between heartbeats, when the heart is at rest. You have a choice of monitors to use.   Manual monitor: You pump up the cuff and use a stethoscope to listen for your pulse.   Electronic monitor: The cuff inflates, and a gauge shows your pulse rate.  ? To take your blood pressure:   Ask your doctor to check your blood pressure monitor to be sure that it is accurate and  that the cuff fits you. Also ask your doctor to watch you use it, to make sure that you are using it right.   You should not eat, use tobacco products, or use medicine known to raise blood pressure (such as some nasal decongestant sprays) before you take your blood pressure.   Avoid taking your blood pressure if you have just exercised. Also avoid taking it if you are nervous or upset. Rest at least 15 minutes before you take your blood pressure.   Be safe with medicines. If you take medicine, take it exactly as prescribed. Call your doctor if you think you are having a problem with your medicine.   Do not smoke. Quitting smoking will help improve  your baby's growth and health. If you need help quitting, talk to your doctor about stop-smoking programs and medicines. These can increase your chances of quitting for good.   Eat a balanced and healthy diet that has lots of fruits and vegetables.  Long-term health  After you have had preeclampsia, you have a higher-than-average risk of heart disease, stroke, and kidney disease. This may be because the same things that cause preeclampsia also cause heart and kidney disease.  To protect your health, work with your doctor on living a heart-healthy lifestyle and getting the checkups you need. Your doctor may also want you to check your blood pressure at home.  Follow-up care is a key part of your treatment and safety. Be sure to make and go to all appointments, and call your doctor if you are having problems. It's also a good idea to know your test results and keep a list of the medicines you take.  Where can you learn more?  Go to [employer]  Enter [phone] in the search box to learn more about "Learning About Preeclampsia After Childbirth."  Current as of: Dec 05, 2017  Content Version: 12.3   2006-2019 [employer], [employer]. Care instructions adapted under license by your healthcare professional. If you have questions about a medical  condition or this instruction, always ask your healthcare professional. [employer], [employer] disclaims any warranty or liability for your use of this information.

## 2018-09-06 NOTE — Procedures (Signed)
[facility]  Department of OB/GYN & Reproductive Sciences  [facility]   [address] Floor  [place], [place]  [address]  [phone], [phone]  September 06, 2018    Burna Forts, M.D.  [facility]  [place] [address] [phone]        Ultrasound Report     Re: Kristin Lee (DOB: 09-16-1995)  MRN: [facility] MRN:  [id number]     INDICATION: di/di twins  TECHNIQUE: transabdominal     Dear Dr. Angelica Pou:    Kristin Lee, who is a 23 year old female, grav 2, para 1, was seen in our office  on September 06, 2018 for ultrasound  due to: di/di twins.     A dichorionic/diamniotic living twin gestation is identified.  Twin A is left,  vertex, and the placenta is posterior. The placental cord insertion was not  visualized.  Twin B is right, breech, and the placenta is posterior.  The  placental cord insertion was not visualized.  The amniotic fluid volume for  both twins is normal.  The DVP for twin A is 5.0 cm.  The DVP for twin B is 4.4  cm.   The uterus was unremarkable. The placenta has no evidence of previa. No  adnexal abnormalities were identified, although neither ovary was visualized.      The following measurements were obtained:    Twin A:    BPD    87 mm,    35 weeks    1 days                                                                                                                                                                                                                                                    HC    312 mm,    34 weeks    6 days      AC    290 mm,    33 weeks    0 days      FL    65 mm,    33 weeks    5 days    Gestational age based on biometry is  34 weeks 1 days with an EDD  of 10/17/2018.   The EFW is 2228 grams.  This  is in the 9.8 %ile.       Twin B:    BPD    93 mm,    37 weeks    5 days      HC    329 mm,    37 weeks    4 days      AC    312 mm,    35 weeks    1 days      FL    66 mm,    33 weeks    6 days    Gestational  age based on biometry is  36 weeks 0 days with an EDD  of  10/04/2018. The EFW of fetus B is 2641 grams.  This is in the 44.7 %ile.      Gestational age based on patient's reported EDC of 10/08/2018 is 35 weeks 3  days.      Umbilical artery Dopplers on fetus A were performed, and were normal, with an  S/D of 2.5-3.1.  Umbilical artery Dopplers were performed on fetus B, and were  normal, with an S/D of 2.5-2.6.      This examination included a  survey of the fetal anatomy. The survey is  comprised of (but not limited to) the following views where technically  possible and clinically appropriate:   Brain  parenchyma, cerebral ventricles (including 4th ventricle), cerebellum,  choroid plexus, cisterna magna, midline falx, cavum septi pellucidi, and nuchal  thickness  Spine - cervical, thoracic, lumbar and sacral spine, shape and curvature  Face - orbits including lens, fetal upper lip, maxilla, mandible, nose, nasal  bone, profile, and neck  Heart  situs, four-chamber image of the heart, great vessels (and/or outflow  tracts), 3 vessel view, superior and inferior vena cava and aortic arch.  Abdomen - diaphragm, kidneys, gallbladder, liver, stomach (left-sided),  kidneys, urinary bladder, ventral abdominal wall, umbilical cord insertion into  the fetal abdomen, and umbilical cord vessel number.  Extremities - legs, arms, hands and feet.      No major structural abnormalities were identified in either twin.      Impressions:  This is a dichorionic, diamniotic twin gestation.  Fetus A has an  EFW at the 9.8%ile, consistent with fetal growth restriction.      There is a 16 percent size discordance between the fetuses.  There are normal  fluid and dopplers in both twins.    Thank you for requesting this consultation from [facility]  regarding the following ultrasound finding:            fetal growth restriction  in fetus A. I spent a total of 15 minutes of face to face time, greater than  50% of that time was  spent discussing the diagnosis and management.  I  recommend twice weekly NST's, weekly dopplers, and delivery at 37-38 weeks.      The patient is scheduled for a return visit on Friday, September 13, 2018 at 1:15  PM.      Sincerely yours,      Pennelope Bracken, M.D.  Perinatal Medicine & Genetics      Electronic Signature: Pennelope Bracken, M.D.  Signed 09/06/2018 at 4:18 PM

## 2018-09-06 NOTE — Progress Notes (Signed)
MFM Clinic Note  Kristin Lee is a 23 y.o. G2P1001 at [redacted]w[redacted]d dichorionic diamniotic twin gestation, dated by LMP consistent with 11 week [place] (Estimated Date of Delivery: 10/08/18).  She presents today for follow up consultation at Clinic with Maternal Fetal Medicine.  This is a spontaneous pregnancy.    Of note, on 08/07/18 the patient was transferred to [facility] for preterm labor at [redacted]w[redacted]d from OSH.  At 3 am on 08/06/18 she began to experience contractions, mostly felt in her back. She had one small spot of bleeding with wiping at 1100 on 08/06/18. She denied any leakage of fluid. She reported chest pain when with contraction, radiating from her belly to her neck, not associated with SOB although she felt generally more short of breath as the pregnancy progresses in the form of a difficulty taking a deep breath.  At the outside hospital, she was placed on Magnesium and penicillin.  Her exam was 2-3/50/-2 and she was transferred to [facility] for a higher level of care. Her most recent ultrasound on 08/06/18 showed Twin A was 1579g (18%), Twin B was 1814g (56.0%) - 13% discordant.     The patient has been previously admitted for c/f PTL in the past and has received two courses of betamethasone.  At the OSH, she was 2-3/50/-2 and upon arrival to [facility], was then noted to be 4/1.5/-3. On repeat SVE, there was no change, so she was taken off magnesium.      In regards to her chest pain, the pain was reproducible upon palpation to the chest wall. ECG within normal limits. O2 sats within normal limits. Given reports of SOB and dizziness with ambulation, BNP and TTE were performed and within normal limits. The symptoms abated when magnesium was discontinued. The atypical chest pain was thought to be musculoskeletal or due to a medication side effect. Return precautions were discussed with the patient.     She was discharged in good condition on 08/10/18 with plan to transfer prenatal care here for the remainder of this pregnancy.   She has had no complaints since that time.     Today, she reports feeling relatively well although she notes new pain with walking and difficulty sleeping due to discomfort as the pregnancy progresses.  She denies vaginal bleeding, contractions or leakage of fluid.  She endorses fetal movement.     Past Medical History:    No significant past medical history.    Past Surgical History:    Surgical History     Procedure Laterality Date Comment Source    KNEE SURGERY Right 2014 ACL and meniscus replacement Provider          Obstetrical History:    OB History   Gravida Para Term Preterm AB Living   2 1 1     1    SAB TAB Ectopic Molar Multiple Live Births             1      # Outcome Date GA Lbr Len/2nd Weight Sex Delivery Anes PTL Lv   2 Current            1 Term 2017 [redacted]w[redacted]d  3.402 kg (7 lb 8 oz) M Vag-Forceps   LIV   Delivered in [place], denies preeclampsia and blood transfusion but notes that she "lost a lot of blood"    Gynecologic History:    The patient denies any history of abnormal Pap smears and sexually transmitted diseases.    Prenatal Course:  The patient has had several episodes c/f preterm labor, and presents today to transfer prenatal care here to [place].      Allergies:  No known drug allergies.    Medications:      Current Outpatient Medications:     PRENATAL VIT 10-IRON-FOLIC-DHA ORAL, Daily for See Label Comment, Disp: , Rfl:     ferrous sulfate 325 mg (65 mg elemental) tablet, Take 1 tablet (325 mg total) by mouth daily with breakfast., Disp: 30 tablet, Rfl: 1    Social History:    The patient denies tobacco, alcohol or illicit drug use.  She and her son live with her mother in [place].  She reports that the father of the baby is involved, but is currently out working in [place], will return in 2 weeks.    Family History:    There is no family history of congenital anomalies, genetic disorders, stillbirth or recurrent miscarriage.    Objective:  BP 105/61   Wt 64.9 kg (143 lb)   LMP  01/01/2018 (Exact Date)   BMI 25.33 kg/m     General: NAD  Respiratory: No respiratory distress  Abdomen: Gravid    Data/Studies:  I have personally reviewed and interpreted the available pertinent data and studies.     09/06/18 [place]:  Performed today, results pending.  In brief, verbal preliminary report showed:  Twin A was visualized in cephalic presentation, MVP 5.05 cm, UA Doppler S/D 2.48-3.16 MoM with normal diastolic flow, EFW 2228 g/9.48%ile.  Twin B was visualized in breech presentation, MVP 3.47 cm, UA Doppler S/D 2.54-2.68 MoM, EFW 2641 g/44.7%ile  Twin Discordance 16%    08/28/18 [place]:  A dichorionic/diamniotic living twin gestation is identified.  Twin A is left, vertex, and the placenta is posterior. The placental cord insertion was not visualized.  Twin B is right, breech, and the placenta is posterior.  The  placental cord insertion was not visualized.  The amniotic fluid volume for both twins is normal.  The DVP for twin A is 4.2 cm.  The DVP for twin B is 6.4 cm.  Umbilical artery Dopplers were performed on twin A, and were normal, with an S/D of 3.01.  Umbilical artery Dopplers were performed on twin B, and were normal, with an S/D of 3.21.  There is a 1 percent size discordance between the fetuses.  Recommend follow up in two weeks.      08/07/18 Chlamydia Trachomatic/Neisseria Gonorrhoeae:  Not detected x2    08/07/18 MRSA Culture:  Negative.    08/07/18 Maternal Echo:  Image quality was good. The patient's blood pressure was 108 mmHg/59 mmHg during the study. The heart rate during the study was 83 bpm. Color Flow Doppler was utilized for this exam. Spectral Doppler was utilized for this exam.  1. The left ventricular volume is normal. LV function is normal. LV ejection fraction is estimated to be 55 to 60%. There is no left ventricular hypertrophy. No segmental wall motion abnormalities present.  2. The right ventricular volume is normal. Right ventricular function is normal.  3. There is borderline  left atrial enlargement. Right atrial size is normal.  4. There is no hemodynamically significant valvular disease.  5. There is no Doppler evidence of LV diastolic dysfunction.  6. The pulmonary artery systolic pressure is at least 21 mmHg based on a right atrial pressure of 3 mmHg. PASP is normal (<30 or <35).  7. No pericardial effusion noted. The inferior vena cava is less  than 21 mm in diameter and collapses with inspiration consistent with a right atrial pressure of 3 mmHg.  8. Aortic root dimension is normal.  Previous Comparison:  No previous study is available for comparison.    08/07/18 Maternal ECG:  Normal ECG  Normal sinus rhythm  Early precordial QRS transition noteworthy  No previous ECGs available  Confirmed by Harley Hallmark, GORDON ([id number]) on 08/07/2018 1:29:02 PM    Assessment/Recommendations:    # Di-Di twin pregnancy, S<D  - Reviewed signs and symptoms of preterm labor and preeclampsia in detail.  Information placed AVS.  - Patient had growth [place] earlier today, with twin discordance now increased to 16%.  Final report pending.  NST will be performed locally tomorrow.  - Continue weekly [place] with Doppler and twice weekly NST until delivery.   - Recommend repeat growth [place] next week to evaluate for vaginal delivery with breech extraction.     #Routine OB  - Has received TDaP  - Patient will self swab for GBS today  - Patient requested iron prescription, ordered today.  Recommend the patient take iron every other day with orange juice to aid in absorption and avoid constipation.    #Delivery planning  - Given di-di twin gestation with S<D, recommend IOL at [redacted] weeks ga.  The patient is aware that earlier delivery may be indicated in the setting of nonreassuring NST, abnormal Doppler or the usual maternal-fetal indications.  This is currently scheduled for 3/10 at 8 pm although the patient would prefer 8am for her induction if possible.  Sohini will call the patient to arrange this.  - Again reviewed options for mode  of delivery and discussed criteria for vaginal birth in twin pregnancies including cephalic presentation in twin A.  Since twin B is the larger twin, I reviewed that C/S may be necessary if the intertwin weight discordance is >20-25% with B being the larger twin.  Patient is open to the possibility of C/S.  I reassured the patient that the risk of vaginal delivery of twin A followed by C/S for twin B is low (around 4%).  Mode of delivery will be finalized next week following repeat US evaluation.    - I discussed risks of C/S including but not limited to increased bleeding and infection in detail with the patient.    Return to clinic as scheduled on 3/6.      She is aware that if there are any concerns for preterm labor, vaginal bleeding, leaking of fluid or decreased fetal movement, then she needs to be evaluated at the nearest hospital.  If transport to [place] is necessary and can be achieved safely, then this may be arranged through her physician.     I spent a total of 30 minutes face-to-face with the patient and >50% of that time was spent counseling regarding the symptoms, treatment plan, risks and/or therapeutic options for the diagnoses above.    It was our pleasure to assist in the prenatal care of Kristin Lee.  If you have any questions regarding her care or the care of other patients, please do not hesitate to contact us.      Future Appointments   Date Time Provider Department Center   09/06/2018  3:30 PM ANTENATAL TESTING PROVIDER 1 ATCMB All Practice   09/13/2018 11:00 AM Hedy Camara, MD OBMB All Practice   09/13/2018  1:15 PM PDC EXAM ROOM 8 PDCMB All Practice   09/17/2018  8:30 PM LD TRIAGE MD L&D MB  All Practice     I, Maigrie McDougal am acting as a Neurosurgeon for services provided by Hedy Camara, MD on 09/06/2018 2:47 PM    The above scribed documentation accurately reflects the services I have provided.    Hedy Camara, MD

## 2018-09-07 ENCOUNTER — Ambulatory Visit: Admit: 2018-09-07 | Discharge: 2018-09-07 | Payer: PRIVATE HEALTH INSURANCE

## 2018-09-07 ENCOUNTER — Ambulatory Visit: Admit: 2018-09-07 | Discharge: 2018-09-07 | Payer: PRIVATE HEALTH INSURANCE | Attending: Maternal & Fetal Medicine

## 2018-09-08 LAB — STREPTOCOCCUS GROUP B CULTURE

## 2018-09-13 DIAGNOSIS — O2243 Hemorrhoids in pregnancy, third trimester: Secondary | ICD-10-CM

## 2018-09-13 DIAGNOSIS — Z3A36 36 weeks gestation of pregnancy: Secondary | ICD-10-CM

## 2018-09-13 DIAGNOSIS — O365931 Maternal care for other known or suspected poor fetal growth, third trimester, fetus 1: Secondary | ICD-10-CM

## 2018-09-13 DIAGNOSIS — O26843 Uterine size-date discrepancy, third trimester: Secondary | ICD-10-CM

## 2018-09-13 DIAGNOSIS — O30049 Twin pregnancy, dichorionic/diamniotic, unspecified trimester: Secondary | ICD-10-CM

## 2018-09-13 DIAGNOSIS — O30043 Twin pregnancy, dichorionic/diamniotic, third trimester: Secondary | ICD-10-CM

## 2018-09-13 DIAGNOSIS — O365991 Maternal care for other known or suspected poor fetal growth, unspecified trimester, fetus 1: Secondary | ICD-10-CM

## 2018-09-13 NOTE — Procedures (Signed)
[facility]  Department of OB/GYN & Reproductive Sciences  Prenatal Diagnostic Center   [address] Floor  [place], [place]  [address]  BOX [phone], [phone]  September 13, 2018    Burna Forts, M.D.  [facility]  [place] [address] [phone]        Ultrasound Report     Re: Kristin Lee (DOB: 10-18-1995)  MRN: [facility] MRN:  [id number]     INDICATION: dichorionic- diamniotic twins  TECHNIQUE: transabdominal     Dear Dr. Angelica Pou:    Ms. Clarence, who is a 23 year old female, grav 2, para 1, was seen in our office  on September 13, 2018 for ultrasound  due to: dichorionic- diamniotic twins, IUGR  fetus A.     A dichorionic/diamniotic living twin gestation is identified.  Twin A is left,  vertex, and the placenta is posterior. The placental cord insertion was not  visualized.  Twin B is right, breech, and the placenta is posterior.  The  placental cord insertion was not visualized.  The amniotic fluid volume for  both twins is normal.  The DVP for twin A is 4.3 cm.  The DVP for twin B is 4.6  cm.   The uterus was unremarkable. The placenta has no evidence of previa. No  adnexal abnormalities were identified, although neither ovary was visualized.        Gestational age based on patient's reported EDC of 10/08/2018 is 36 weeks 3  days.    Umbilical artery Dopplers were performed, and were normal, with an S/D of 2.2.         No major structural abnormalities were identified in either twin.      Impressions:  Normal amniotic fluid volume in both twin sacs. Fetus A umbilical  artery Doppler velocimetry within normal limits.         Sincerely yours,      Johnsie Kindred, M.D.  Perinatal Medicine & Genetics      Electronic Signature: Johnsie Kindred, M.D.  Signed 09/13/2018 at 2:34 PM

## 2018-09-13 NOTE — Progress Notes (Signed)
Reproductive Genetics Progress Notes, Consult Letters and Interpretations are filed in Chart Review under the "Notes" tab. Reproductive Genetics Ultrasounds are filed under the "Imaging" tab.

## 2018-09-13 NOTE — Progress Notes (Signed)
MFM Clinic Note  Kristin Lee is a 23 y.o. G2P1001 at [redacted]w[redacted]d dichorionic diamniotic twin gestation, dated by LMP consistent with 11 week [place] (Estimated Date of Delivery: 10/08/18).  She presents today for follow up consultation at Clinic with Maternal Fetal Medicine.  This is a spontaneous pregnancy.    Of note, on 08/07/18 the patient was transferred to [facility] for preterm labor at [redacted]w[redacted]d from OSH.  At 3 am on 08/06/18 she began to experience contractions, mostly felt in her back. She had one small spot of bleeding with wiping at 1100 on 08/06/18. She denied any leakage of fluid. She reported chest pain when with contraction, radiating from her belly to her neck, not associated with SOB although she felt generally more short of breath as the pregnancy progresses in the form of a difficulty taking a deep breath.  At the outside hospital, she was placed on Magnesium and penicillin.  Her exam was 2-3/50/-2 and she was transferred to [facility] for a higher level of care. Her most recent ultrasound on 08/06/18 showed Twin A was 1579g (18%), Twin B was 1814g (56.0%) - 13% discordant.     The patient has been previously admitted for c/f PTL in the past and has received two courses of betamethasone.  At the OSH, she was 2-3/50/-2 and upon arrival to [facility], was then noted to be 4/1.5/-3. On repeat SVE, there was no change, so she was taken off magnesium.      In regards to her chest pain, the pain was reproducible upon palpation to the chest wall. ECG within normal limits. O2 sats within normal limits. Given reports of SOB and dizziness with ambulation, BNP and TTE were performed and within normal limits. The symptoms abated when magnesium was discontinued. The atypical chest pain was thought to be musculoskeletal or due to a medication side effect. Return precautions were discussed with the patient.     She was discharged in good condition on 08/10/18 with plan to transfer prenatal care here for the remainder of this pregnancy.   She has had no complaints since that time.     Today, she reports feeling relatively well although she notes new pain with walking and difficulty sleeping due to discomfort as the pregnancy progresses.  She denies vaginal bleeding, contractions or leakage of fluid.  She endorses fetal movement.  She reports some slight lower extremity edema after sitting for long periods of time, and worsening hemorrhoids.  She denies headaches.  She received an iron infusion yesterday.     Past Medical History:    No significant past medical history.    Past Surgical History:    Surgical History     Procedure Laterality Date Comment Source    KNEE SURGERY Right 2014 ACL and meniscus replacement Provider          Obstetrical History:    OB History   Gravida Para Term Preterm AB Living   2 1 1     1    SAB TAB Ectopic Molar Multiple Live Births             1      # Outcome Date GA Lbr Len/2nd Weight Sex Delivery Anes PTL Lv   2 Current            1 Term 2017 [redacted]w[redacted]d  3.402 kg (7 lb 8 oz) M Vag-Forceps   LIV   Delivered in [place], denies preeclampsia and blood transfusion but notes that she "lost a lot  of blood"  2017- occipitaposterior forceps assisted delivery    Gynecologic History:    The patient denies any history of abnormal Pap smears and sexually transmitted diseases.    Prenatal Course:    The patient has had several episodes c/f preterm labor, and has transfered prenatal care here to [place].      Allergies:  No known drug allergies.    Medications:      Current Outpatient Medications:     ferrous sulfate 325 mg (65 mg elemental) tablet, Take 1 tablet (325 mg total) by mouth daily with breakfast., Disp: 30 tablet, Rfl: 1    PRENATAL VIT 10-IRON-FOLIC-DHA ORAL, Daily for See Label Comment, Disp: , Rfl:     Social History:    The patient denies tobacco, alcohol or illicit drug use.  She and her son live with her mother in [place].  She reports that the father of the baby is involved, but is currently out working in [place], will return in 2 weeks.    Family History:    There is no family history of congenital anomalies, genetic disorders, stillbirth or recurrent miscarriage.    Objective:  BP 98/58   Pulse 89   Wt 65.3 kg (144 lb)   LMP 01/01/2018 (Exact Date)   BMI 25.51 kg/m     General: NAD  Respiratory: No respiratory distress  Abdomen: Gravid    PDC [place] later today.    Data/Studies:  I have personally reviewed and interpreted the available pertinent data and studies.     09/06/18 GBS:  Positive.    09/06/18 [place]:  Performed today, results pending.  In brief, verbal preliminary report showed:  Twin A was visualized in cephalic presentation, MVP 5.05 cm, UA Doppler S/D 2.48-3.16 MoM with normal diastolic flow, EFW 2228 g/9.48%ile.  Twin B was visualized in breech presentation, MVP 3.47 cm, UA Doppler S/D 2.54-2.68 MoM, EFW 2641 g/44.7%ile  Twin Discordance 16%    08/28/18 [place]:  A dichorionic/diamniotic living twin gestation is identified.  Twin A is left, vertex, and the placenta is posterior. The placental cord insertion was not visualized.  Twin B is right, breech, and the placenta is posterior.  The placental cord insertion was not visualized.  The amniotic fluid volume for both twins is normal.  The DVP for twin A is 4.2 cm.  The DVP for twin B is 6.4 cm.  Umbilical artery Dopplers were performed on twin A, and were normal, with an S/D of 3.01.  Umbilical artery Dopplers were performed on twin B, and were normal, with an S/D of 3.21.  There is a 1 percent size discordance between the fetuses.  Recommend follow up in two weeks.      08/07/18 Chlamydia Trachomatic/Neisseria Gonorrhoeae:  Not detected x2    08/07/18 MRSA Culture:  Negative.    08/07/18 Maternal Echo:  Image quality was good. The patient's blood pressure was 108 mmHg/59 mmHg during the study. The heart rate during the study was 83 bpm. Color Flow Doppler was utilized for this exam. Spectral Doppler was utilized for this exam.  1. The left ventricular volume is  normal. LV function is normal. LV ejection fraction is estimated to be 55 to 60%. There is no left ventricular hypertrophy. No segmental wall motion abnormalities present.  2. The right ventricular volume is normal. Right ventricular function is normal.  3. There is borderline left atrial enlargement. Right atrial size is normal.  4. There is no hemodynamically significant  valvular disease.  5. There is no Doppler evidence of LV diastolic dysfunction.  6. The pulmonary artery systolic pressure is at least 21 mmHg based on a right atrial pressure of 3 mmHg. PASP is normal (<30 or <35).  7. No pericardial effusion noted. The inferior vena cava is less than 21 mm in diameter and collapses with inspiration consistent with a right atrial pressure of 3 mmHg.  8. Aortic root dimension is normal.  Previous Comparison:  No previous study is available for comparison.    08/07/18 Maternal ECG:  Normal ECG  Normal sinus rhythm  Early precordial QRS transition noteworthy  No previous ECGs available  Confirmed by Harley Hallmark, GORDON ([id number]) on 08/07/2018 1:29:02 PM    Assessment/Recommendations:    # Di-Di twin pregnancy, S<D  - Reviewed signs and symptoms of preterm labor and preeclampsia in detail.  Information placed AVS.  - Patient had growth Korea 2/28, with twin discordance now increased to 16%.  Repeat growth [place] scheduled for later today in the [facility] to evaluate for vaginal delivery with breech extraction.  - Continue weekly [place] with Dopplers and twice weekly NST until delivery.     # Hemorrhoids  - Recommend preparation H, and Tucks witch hazel wipes to aid in irritation  - I reviewed that hemorrhoids often worsen during pregnancy and reassured her that she should improve following delivery.    #Routine OB  - Has received TDaP  - GBS positive. Reviewed that she will receive antibiotics in labor.     #Delivery planning  - Given di-di twin gestation with S<D, recommend IOL at [redacted] weeks ga.  The patient is aware that earlier delivery may be  indicated in the setting of nonreassuring NST, abnormal Doppler or the usual maternal-fetal indications.  This is currently scheduled for 3/10 at 8 pm although the patient would prefer 8am for her induction if possible.  I called the L&D charge nurse, and made plan for patient to be admitted to antepartum at 1 pm on 3/10.    - Again reviewed options for mode of delivery and discussed criteria for vaginal birth in twin pregnancies including cephalic presentation in twin A.  Since twin B is the larger twin, I reviewed that C/S may be necessary if the intertwin weight discordance is >20-25% with B being the larger twin.  Patient is open to the possibility of C/S, but would like to try for a vaginal delivery if possible.  I reassured the patient that the risk of vaginal delivery of twin A followed by C/S for twin B is low (around 4%).  Mode of delivery will be finalized next week following repeat US evaluation.    - I discussed risks of C/S including but not limited to increased bleeding and infection in detail with the patient.    No follow up with this clinic needed prior to delivery.  RTC 6 weeks postpartum.      She is aware that if there are any concerns for preterm labor, vaginal bleeding, leaking of fluid or decreased fetal movement, then she needs to be evaluated at the nearest hospital.  If transport to [place] is necessary and can be achieved safely, then this may be arranged through her physician.     I spent a total of 30 minutes face-to-face with the patient and >50% of that time was spent counseling regarding the symptoms, treatment plan, risks and/or therapeutic options for the diagnoses above.    It was our pleasure to assist in  the prenatal care of Kristin Lee.  If you have any questions regarding her care or the care of other patients, please do not hesitate to contact us.    Future Appointments   Date Time Provider Department Center   09/13/2018  1:15 PM PDC EXAM ROOM 8 PDCMB All Practice   09/17/2018  8:30  PM LD TRIAGE MD L&D MB All Practice     I, Maigrie McDougal am acting as a Neurosurgeon for services provided by Hedy Camara, MD on 09/13/2018 at 12:00 PM

## 2018-09-14 ENCOUNTER — Ambulatory Visit: Admit: 2018-09-14 | Discharge: 2018-09-14 | Payer: PRIVATE HEALTH INSURANCE

## 2018-09-14 ENCOUNTER — Ambulatory Visit: Admit: 2018-09-14 | Discharge: 2018-09-14 | Payer: PRIVATE HEALTH INSURANCE | Attending: Maternal & Fetal Medicine

## 2018-09-14 NOTE — Telephone Encounter (Signed)
Pt called in and reported that she broke her water an hour ago around 0930, she currently still feels fluid leaking, clear pink tinged fluid per pt report. Pt denies contractions, +FM. Pt lives in [place], which is 4 hours away, pt reported she's already on the road and is 1 hour away from [facility], but doesn't know which hospital to go to. Pt is currently driving through [place]. Advised pt to go to nearest hospital to be eval, and since she's in [place] right now, she needs to go to hospital there. Pt verbalized understanding.

## 2018-12-28 ENCOUNTER — Encounter (HOSPITAL_COMMUNITY): Payer: Self-pay

## 2020-07-16 IMAGING — US US OB TRANSVAGINAL
1 series · 15 of 28 positions shown · non-contrast
Comparison: None.

CLINICAL DATA: 22-year-old positive HCG levels presenting with
female with pelvic pain.

EXAM:
OBSTETRIC <14 WK US AND TRANSVAGINAL OB US
TECHNIQUE: Both transabdominal and transvaginal ultrasound examinations were
performed for complete evaluation of the gestation as well as the
maternal uterus, adnexal regions, and pelvic cul-de-sac.
Transvaginal technique was performed to assess early pregnancy.

[Series 1: us ob transvaginal · 15 of 63 slices shown]
[im 1/63]
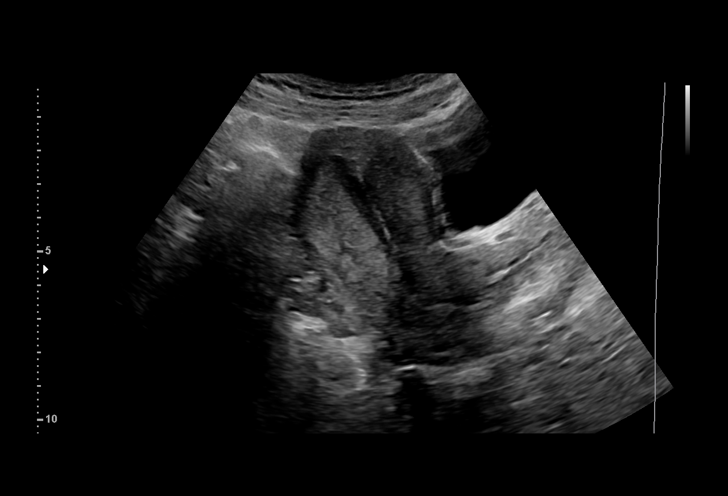
[im 5/63]
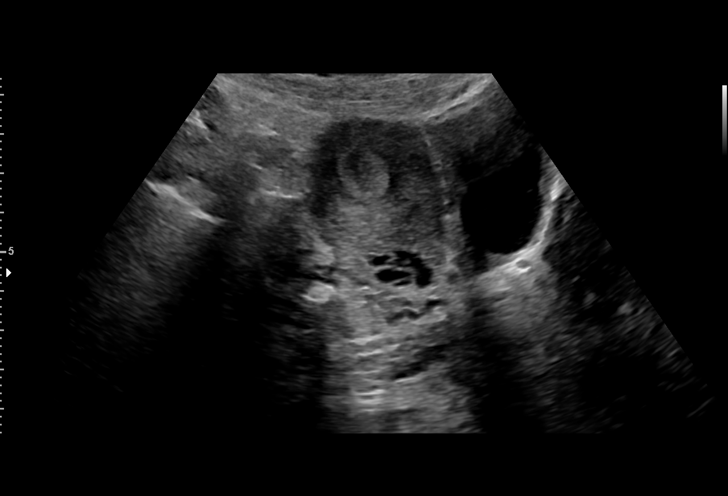
[im 10/63]
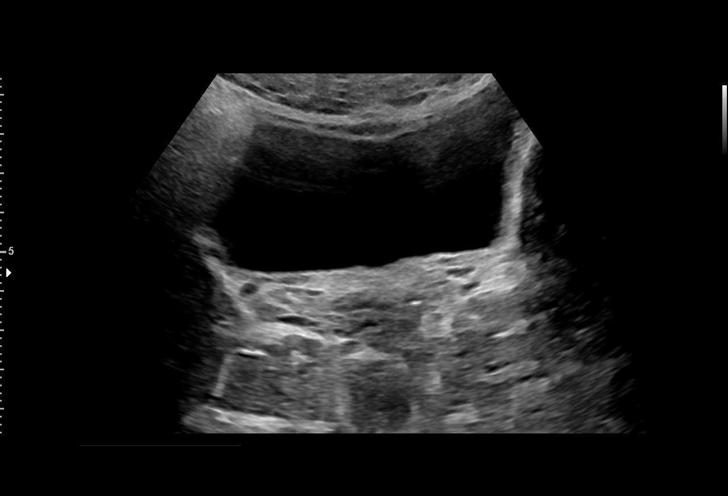
[im 14/63]
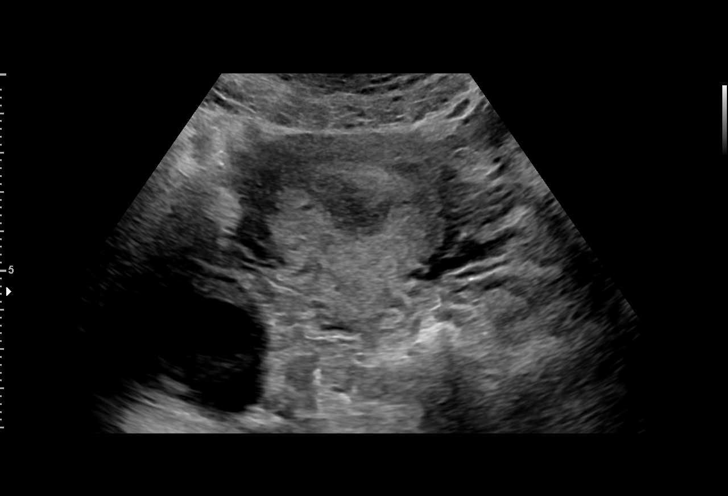
[im 19/63]
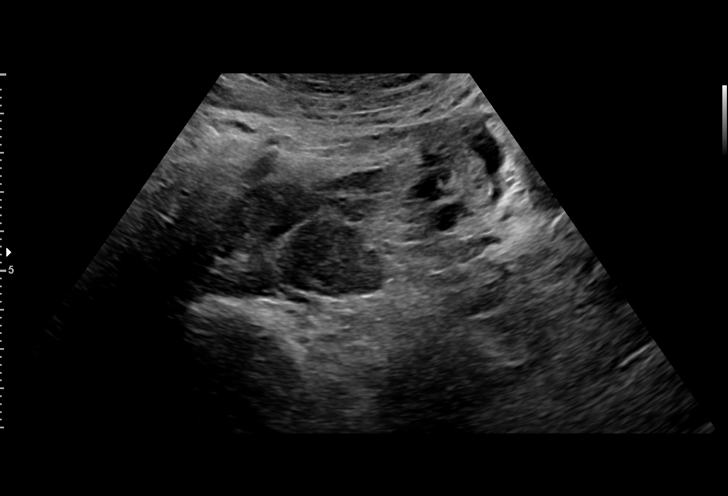
[im 23/63]
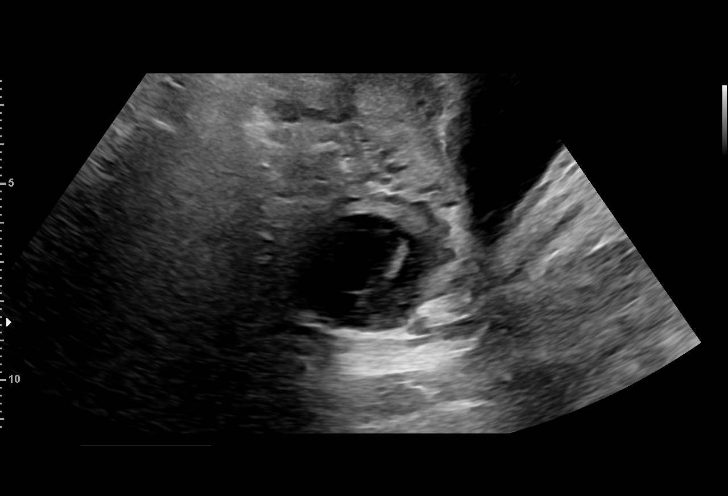
[im 28/63]
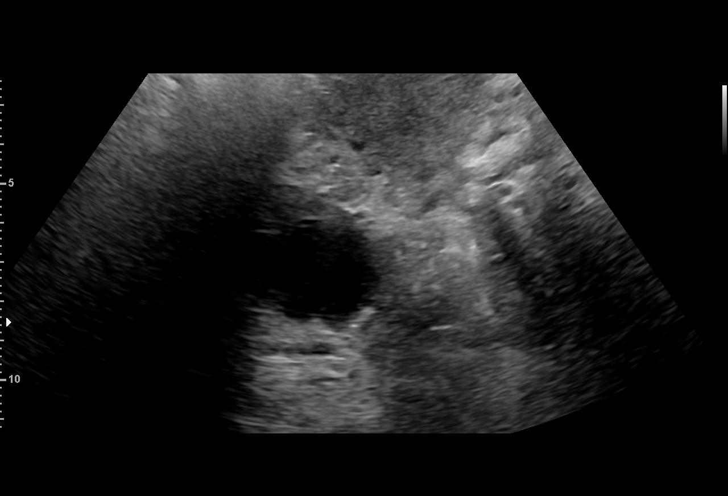
[im 33/63]
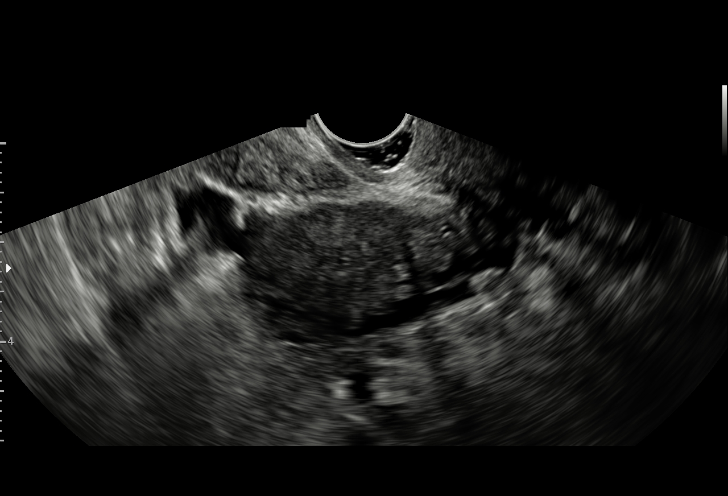
[im 35/63]
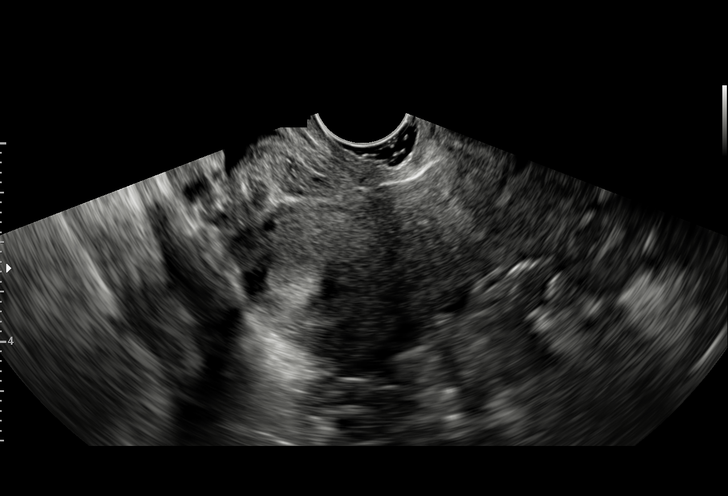
[im 40/63]
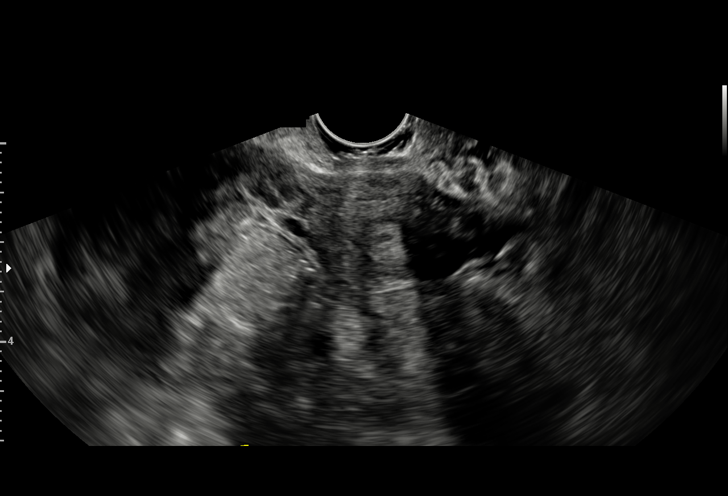
[im 44/63]
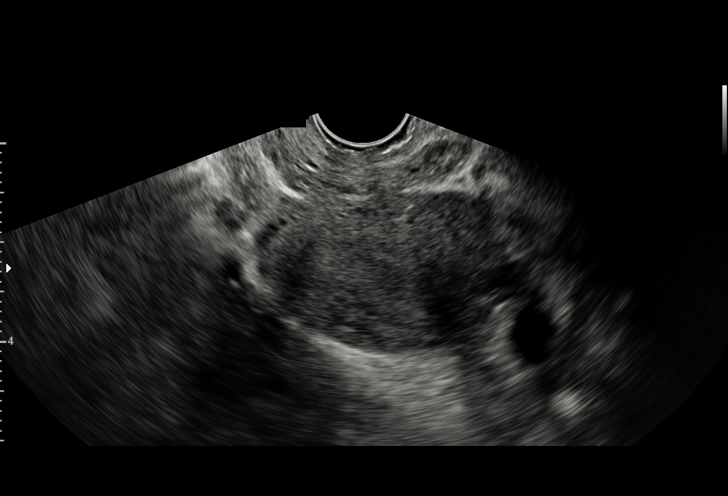
[im 49/63]
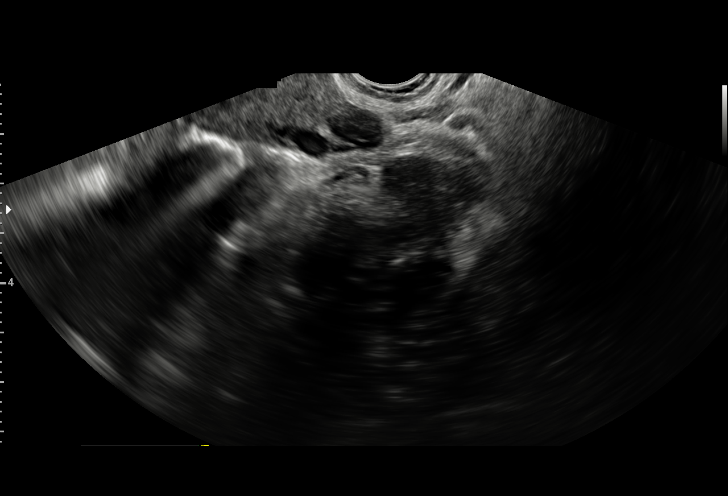
[im 53/63]
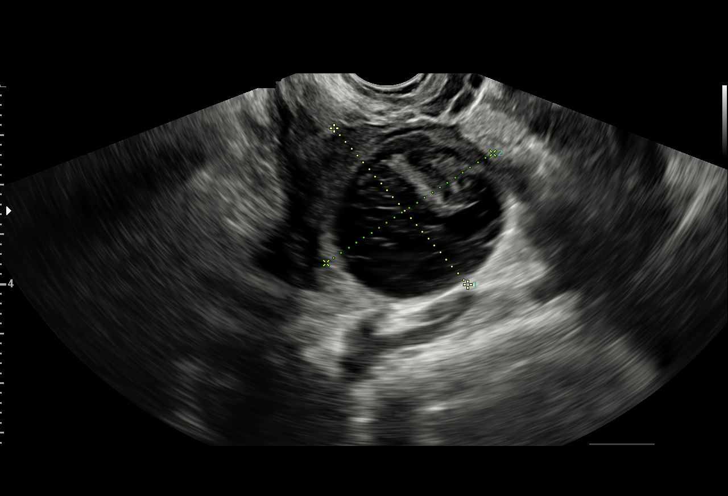
[im 58/63]
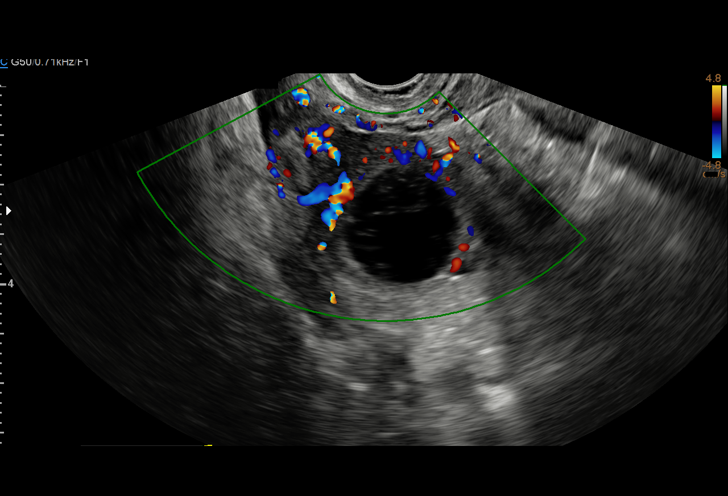
[im 63/63]
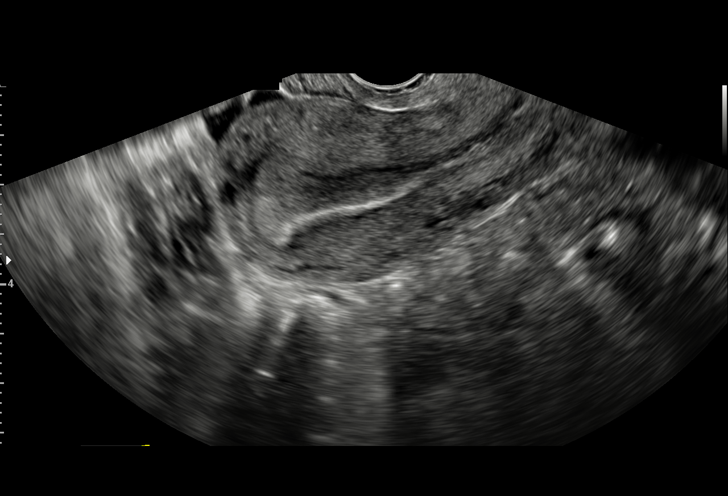

[15 of 28 positions shown; findings below may reference images not displayed]

FINDINGS: The uterus is anteverted appears unremarkable. The endometrium is
unremarkable and measures up to 18 mm at the fundus. No intrauterine
pregnancy identified.

The right ovary measures 3.6 x 2.4 x 2.2 cm. There is a 3.5 x 3.3 x
3.0 cm complex cyst with lace-like internal structures in the right
ovary most consistent with a hemorrhagic cyst or corpus luteum. No
internal vascularity within this is noted. The left ovary measures
3.1 x 1.8 x 1.9 cm appears unremarkable.

No free fluid within the pelvis..
IMPRESSION: 1. No intrauterine pregnancy identified and no adnexal masses. This
is considered a pregnancy of unknown location and differential
diagnosis includes an early pregnancy, recent spontaneous abortion,
or an ectopic pregnancy. Clinical correlation and follow-up with
serial HCG levels and ultrasound recommended.
2. Right ovarian hemorrhagic cyst/corpus luteum.

## 2020-10-27 IMAGING — US US MFM OB DETAIL EACH ADDL GEST+14 WK
1 series · 16 of 28 positions shown · non-contrast
Comparison: none

[Series 1: us mfm ob detail each addl gest+14 wk · 189 acquisitions, 16 frames shown]
[im 1/189]
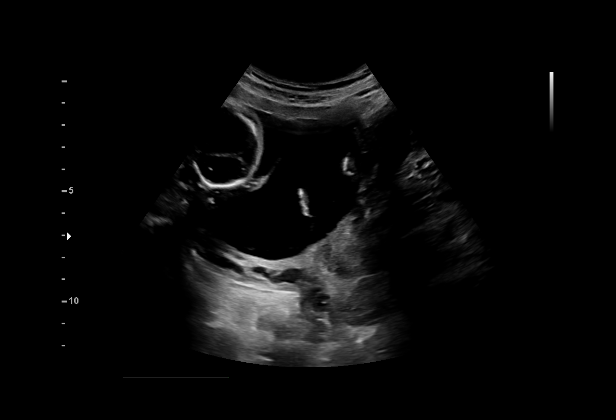
[im 14/189]
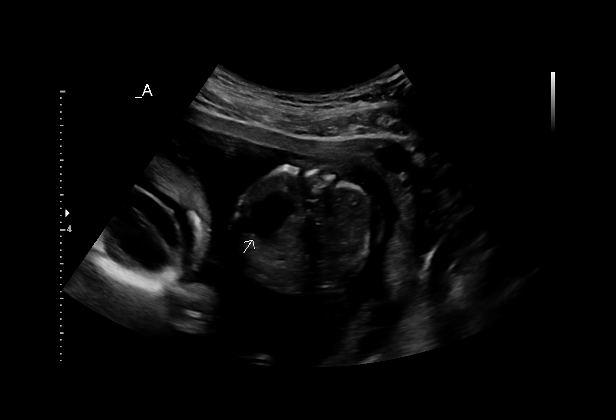
[im 28/189]
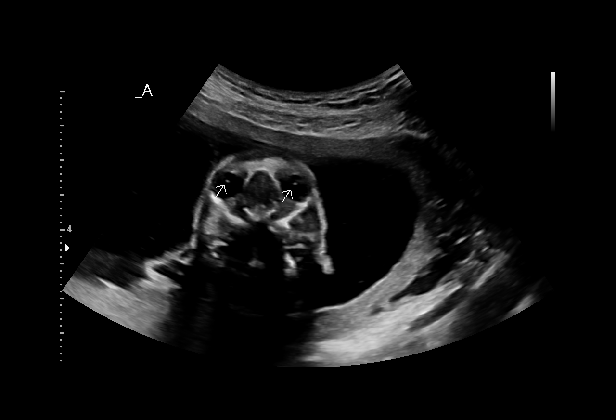
[im 42/189]
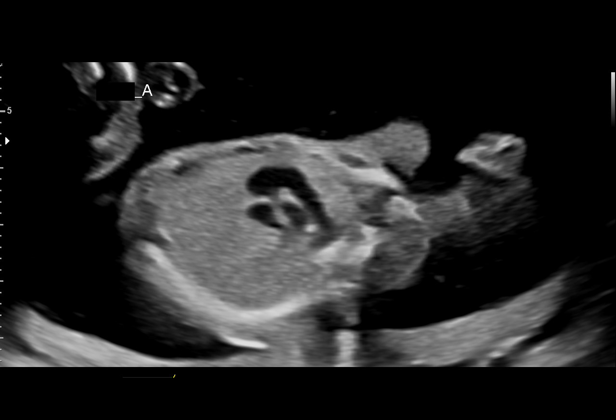
[im 49/189]
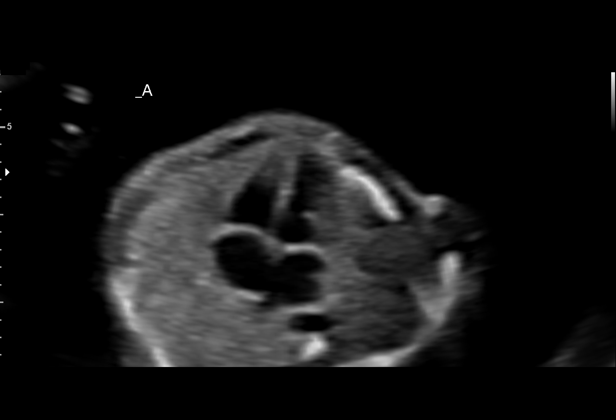
[im 63/189]
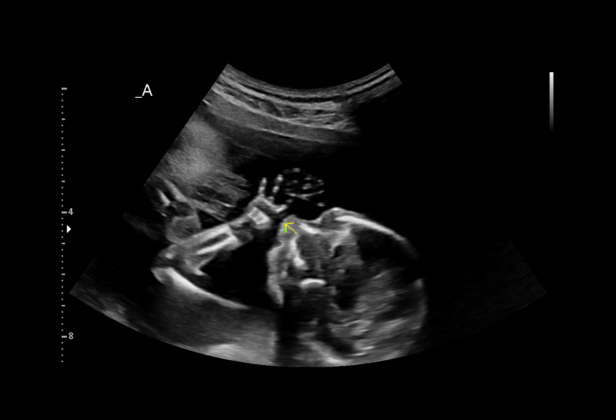
[im 77/189]
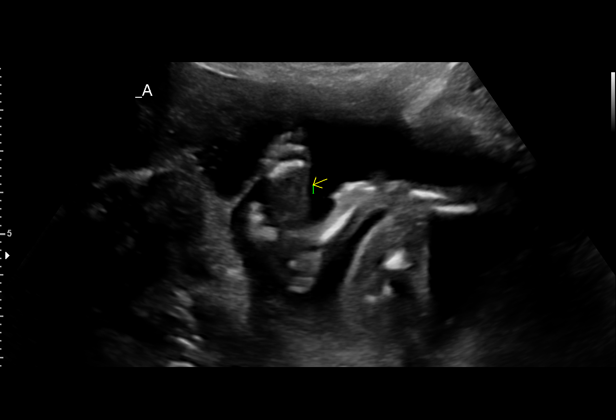
[im 91/189]
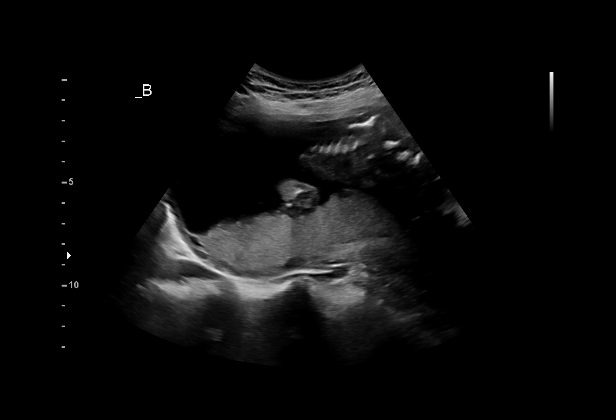
[im 98/189]
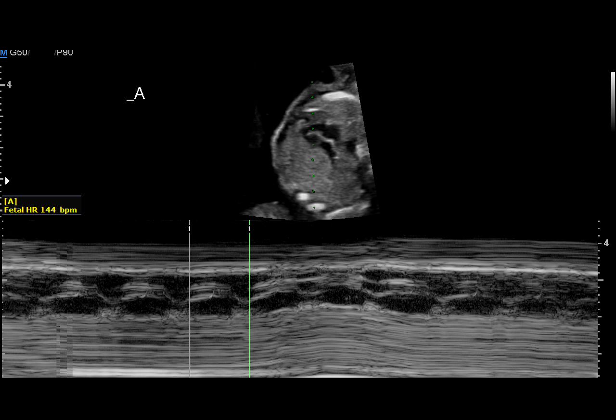
[im 112/189]
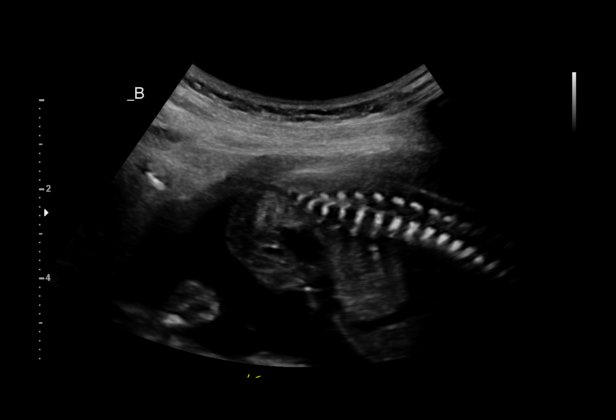
[im 126/189]
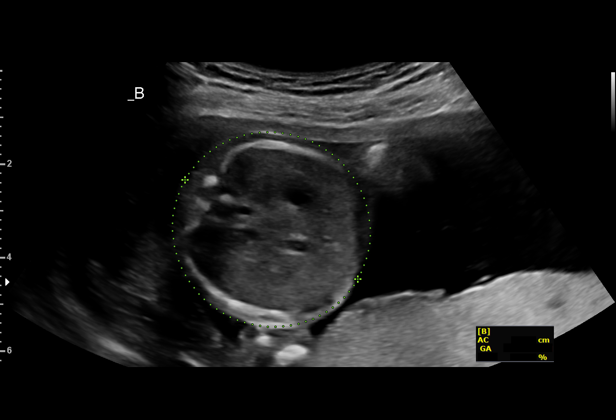
[im 140/189]
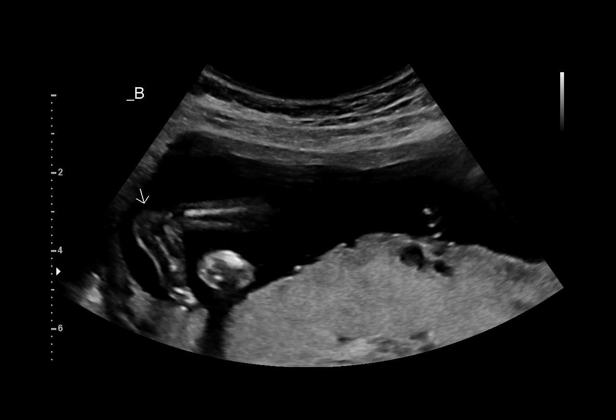
[im 147/189]
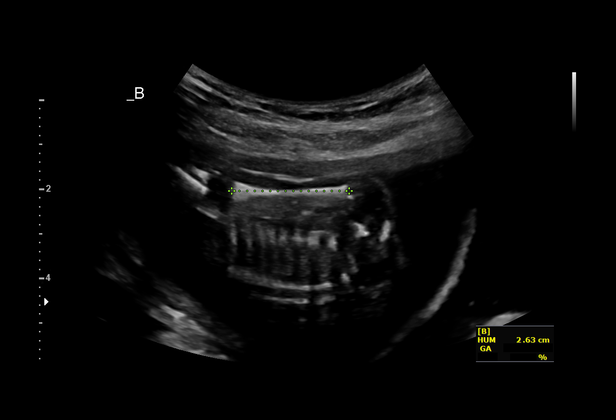
[im 161/189]
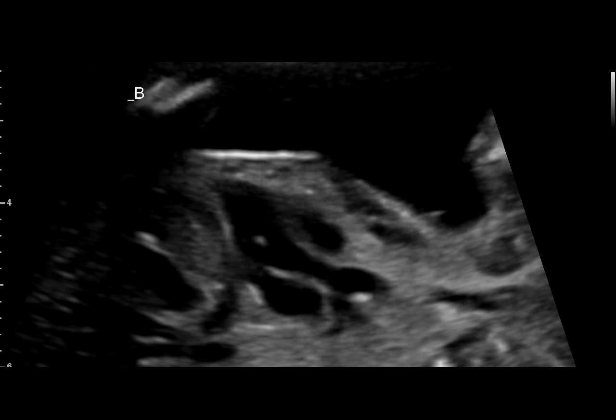
[im 175/189]
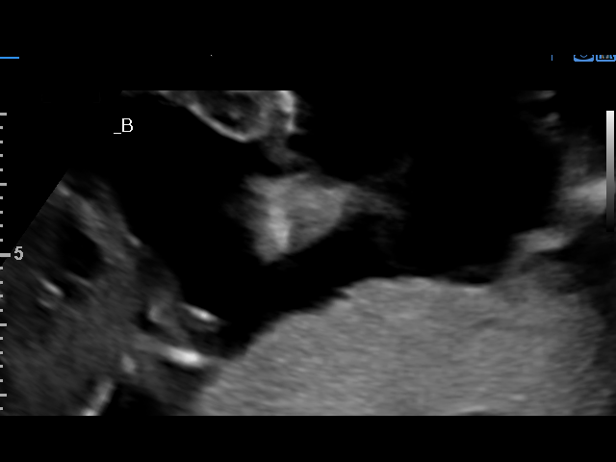
[im 189/189]
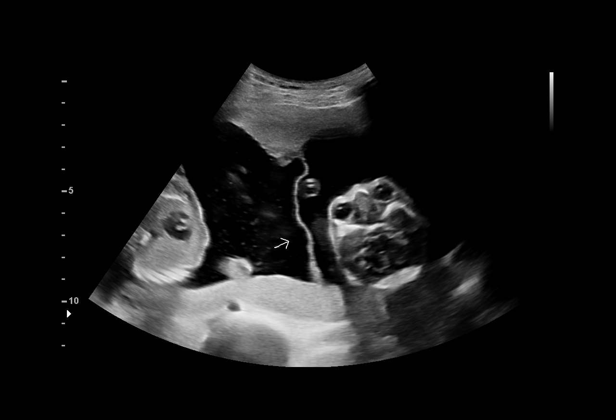

[16 of 28 positions shown; findings below may reference images not displayed]

+14 WK                                            BERNABE

Indications

Twin pregnancy, di/di, second trimester
18 weeks gestation of pregnancy
Encounter for antenatal screening for
malformations
Abnormal fetal ultrasound (Twin B EIF LV)
Vital Signs

BMI:
Fetal Evaluation (Fetus A)

Num Of Fetuses:         2
Fetal Heart Rate(bpm):  144
Cardiac Activity:       Observed
Fetal Lie:              Maternal left side
Presentation:           Breech
Placenta:               Posterior
P. Cord Insertion:      Visualized
Membrane Desc:      Dividing Membrane seen - Dichorionic.

Amniotic Fluid
AFI FV:      Within normal limits

Largest Pocket(cm)
5.88
Biometry (Fetus A)

BPD:      41.3  mm     G. Age:  18w 3d         48  %    CI:        74.74   %    70 - 86
FL/HC:      17.4   %    16.1 -
HC:      151.6  mm     G. Age:  18w 1d         24  %    HC/AC:      1.17        1.09 -
AC:      129.1  mm     G. Age:  18w 4d         43  %    FL/BPD:     63.9   %
FL:       26.4  mm     G. Age:  18w 0d         25  %    FL/AC:      20.4   %    20 - 24
HUM:      26.1  mm     G. Age:  18w 1d         43  %
CER:      18.9  mm     G. Age:  18w 3d         46  %
NFT:       3.7  mm

LV:        6.3  mm
CM:          4  mm

Est. FW:     231  gm      0 lb 8 oz     39  %     FW Discordancy         4  %
OB History

Gravidity:    2         Term:   1
Living:       1
Gestational Age (Fetus A)

LMP:           18w 4d        Date:  12/29/17                 EDD:   10/05/18
U/S Today:     18w 2d                                        EDD:   10/07/18
Best:          18w 4d     Det. By:  LMP  (12/29/17)          EDD:   10/05/18
Anatomy (Fetus A)

Cranium:               Appears normal         LVOT:                   Appears normal
Cavum:                 Appears normal         Aortic Arch:            Appears normal
Ventricles:            Appears normal         Ductal Arch:            Appears normal
Choroid Plexus:        Appears normal         Diaphragm:              Appears normal
Cerebellum:            Appears normal         Stomach:                Appears normal, left
sided
Posterior Fossa:       Appears normal         Abdomen:                Appears normal
Nuchal Fold:           Appears normal         Abdominal Wall:         Appears nml (cord
insert, abd wall)
Face:                  Appears normal         Cord Vessels:           Appears normal (3
(orbits and profile)                           vessel cord)
Lips:                  Appears normal         Kidneys:                Appear normal
Palate:                Not well visualized    Bladder:                Appears normal
Thoracic:              Appears normal         Spine:                  Appears normal
Heart:                 Appears normal         Upper Extremities:      Appears normal
(4CH, axis, and situs
RVOT:                  Appears normal         Lower Extremities:      Appears normal

Other:  Female gender Heels and RT 5th digit visualized. Open hands
visualized. Nasal bone visualized. Technically difficult due to fetal
position.

Fetal Evaluation (Fetus B)

Num Of Fetuses:         2
Fetal Heart Rate(bpm):  146
Cardiac Activity:       Observed
Fetal Lie:              Maternal right side
Presentation:           Cephalic
Placenta:               Posterior
P. Cord Insertion:      Visualized
Membrane Desc:      Dividing Membrane seen - Dichorionic.

Amniotic Fluid
AFI FV:      Within normal limits

Largest Pocket(cm)
5.65
Biometry (Fetus B)

BPD:      45.5  mm     G. Age:  19w 5d         91  %    CI:        81.39   %    70 - 86
FL/HC:      16.8   %    16.1 -
HC:      159.2  mm     G. Age:  18w 6d         52  %    HC/AC:      1.22        1.09 -
AC:      130.4  mm     G. Age:  18w 4d         47  %    FL/BPD:     58.9   %
FL:       26.8  mm     G. Age:  18w 1d         30  %    FL/AC:      20.6   %    20 - 24
HUM:      26.1  mm     G. Age:  18w 1d         43  %
CER:      19.6  mm     G. Age:  18w 6d         58  %
NFT:       4.3  mm

LV:        8.1  mm
CM:        3.9  mm

Est. FW:     241  gm      0 lb 9 oz     44  %     FW Discordancy      0 \ 4 %
Gestational Age (Fetus B)

LMP:           18w 4d        Date:  12/29/17                 EDD:   10/05/18
U/S Today:     18w 6d                                        EDD:   10/03/18
Best:          18w 4d     Det. By:  LMP  (12/29/17)          EDD:   10/05/18
Anatomy (Fetus B)

Cranium:               Appears normal         LVOT:                   Appears normal
Cavum:                 Appears normal         Aortic Arch:            Appears normal
Ventricles:            Appears normal         Ductal Arch:            Appears normal
Choroid Plexus:        Appears normal         Diaphragm:              Appears normal
Cerebellum:            Appears normal         Stomach:                Appears normal, left
sided
Posterior Fossa:       Appears normal         Abdomen:                Appears normal
Nuchal Fold:           Appears normal         Abdominal Wall:         Appears nml (cord
insert, abd wall)
Face:                  Appears normal         Cord Vessels:           Appears normal (3
(orbits and profile)                           vessel cord)
Lips:                  Appears normal         Kidneys:                Appear normal
Palate:                Not well visualized    Bladder:                Appears normal
Thoracic:              Appears normal         Spine:                  Ltd views no
intracranial signs of
NT
Heart:                 Echogenic focus        Upper Extremities:      Appears normal
in LV
RVOT:                  Appears normal         Lower Extremities:      Appears normal

Other:  Male gender. Heels and LT 5th digit visualized. Open hands
visualized. Nasal bone visualized. Technically difficult due to fetal
position.
Cervix Uterus Adnexa

Cervix
Length:           3.85  cm.
Normal appearance by transabdominal scan.
Uterus
No abnormality visualized.

Left Ovary
Size(cm)       3.3  x   1.2    x  1.8       Vol(ml):
Within normal limits.

Right Ovary
Size(cm)       3.2  x   1.7    x  1.4       Vol(ml):
Within normal limits.
Impression

Dichorionic-diamniotic twin pregnancy.
Twin A: Maternal left, breech, posterior placenta. FEMALE
fetus. No markers of aneuploidies or fetal structural defects
are seen. Fetal biometry is consistent with her previously-
established dates. Amniotic fluid is normal and good fetal
activity is seen.

Twin B: Maternal right, cephalic, posterior placenta, MALE
fetus. An echogenic intracardiac focus is seen. No other
markers of aneuploidies or fetal structural defects are seen.
Fetal biometry is consistent with her previously-established
dates. Amniotic fluid is normal and good fetal activity is seen.
Growth discordancy: 4% (normal).

On cell-free fetal DNA screening, the risks of fetal
aneuploidies are not increased.

I informed the patient that given that she had Janarys Ysupov for fetal
aneuploidies on cell-free fetal DNA screening, finding of
echogenic intracardiac focus should be considered a normal
variant and that the risk of trisomy 21 is not increased. I also
reassured that echogenic focus does not increase the risk of
cardiac defects.
Recommendations

An appointment was made for her to return in 4 weeks for
fetal growth assessment and completion of fetal anatomy
(spine in twin B).
